# Patient Record
Sex: Female | Born: 1975 | Race: White | Hispanic: No | Smoking: Current some day smoker
Health system: Southern US, Community
[De-identification: ages and names within clinical notes are randomized; demographics above are authoritative.]

## PROBLEM LIST (undated history)

## (undated) ENCOUNTER — Ambulatory Visit (HOSPITAL_BASED_OUTPATIENT_CLINIC_OR_DEPARTMENT_OTHER): Payer: Self-pay

## (undated) DIAGNOSIS — E039 Hypothyroidism, unspecified: Secondary | ICD-10-CM

## (undated) DIAGNOSIS — F32A Depression, unspecified: Secondary | ICD-10-CM

## (undated) DIAGNOSIS — N739 Female pelvic inflammatory disease, unspecified: Secondary | ICD-10-CM

## (undated) DIAGNOSIS — D649 Anemia, unspecified: Secondary | ICD-10-CM

## (undated) DIAGNOSIS — M5126 Other intervertebral disc displacement, lumbar region: Secondary | ICD-10-CM

## (undated) DIAGNOSIS — F419 Anxiety disorder, unspecified: Secondary | ICD-10-CM

## (undated) DIAGNOSIS — T7840XA Allergy, unspecified, initial encounter: Secondary | ICD-10-CM

## (undated) DIAGNOSIS — F329 Major depressive disorder, single episode, unspecified: Secondary | ICD-10-CM

## (undated) DIAGNOSIS — K5900 Constipation, unspecified: Secondary | ICD-10-CM

## (undated) DIAGNOSIS — K219 Gastro-esophageal reflux disease without esophagitis: Secondary | ICD-10-CM

## (undated) DIAGNOSIS — M5020 Other cervical disc displacement, unspecified cervical region: Secondary | ICD-10-CM

## (undated) DIAGNOSIS — G43909 Migraine, unspecified, not intractable, without status migrainosus: Secondary | ICD-10-CM

## (undated) DIAGNOSIS — N921 Excessive and frequent menstruation with irregular cycle: Secondary | ICD-10-CM

## (undated) HISTORY — DX: Depression, unspecified: F32.A

## (undated) HISTORY — DX: Allergy, unspecified, initial encounter: T78.40XA

## (undated) HISTORY — PX: CARPAL TUNNEL RELEASE: SHX101

## (undated) HISTORY — PX: ANTERIOR CERVICAL DECOMPRESSION/DISCECTOMY FUSION 4 LEVEL/HARDWARE REMOVAL: SHX5551

## (undated) HISTORY — DX: Migraine, unspecified, not intractable, without status migrainosus: G43.909

## (undated) HISTORY — PX: TOOTH EXTRACTION: SUR596

## (undated) HISTORY — DX: Gastro-esophageal reflux disease without esophagitis: K21.9

## (undated) HISTORY — DX: Female pelvic inflammatory disease, unspecified: N73.9

## (undated) HISTORY — PX: TUBAL LIGATION: SHX77

## (undated) HISTORY — DX: Anemia, unspecified: D64.9

## (undated) HISTORY — DX: Hypothyroidism, unspecified: E03.9

## (undated) HISTORY — DX: Other intervertebral disc displacement, lumbar region: M51.26

## (undated) HISTORY — PX: OTHER SURGICAL HISTORY: SHX169

## (undated) HISTORY — DX: Constipation, unspecified: K59.00

## (undated) HISTORY — DX: Excessive and frequent menstruation with irregular cycle: N92.1

## (undated) HISTORY — DX: Anxiety disorder, unspecified: F41.9

## (undated) HISTORY — DX: Other cervical disc displacement, unspecified cervical region: M50.20

## (undated) HISTORY — PX: UPPER GASTROINTESTINAL ENDOSCOPY: SHX188

## (undated) HISTORY — PX: ABLATION: SHX5711

---
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
## 2002-02-01 HISTORY — PX: ESOPHAGOGASTRODUODENOSCOPY: SHX1529

## 2008-11-30 ENCOUNTER — Ambulatory Visit (HOSPITAL_BASED_OUTPATIENT_CLINIC_OR_DEPARTMENT_OTHER): Admission: RE | Admit: 2008-11-30 | Discharge: 2008-11-30 | Payer: Self-pay | Admitting: Orthopedic Surgery

## 2010-12-08 ENCOUNTER — Emergency Department (HOSPITAL_COMMUNITY)
Admission: EM | Admit: 2010-12-08 | Discharge: 2010-12-08 | Payer: Self-pay | Source: Home / Self Care | Admitting: Emergency Medicine

## 2011-02-25 LAB — POCT HEMOGLOBIN-HEMACUE: Hemoglobin: 14.4 g/dL (ref 12.0–15.0)

## 2011-03-26 NOTE — Op Note (Signed)
NAMEJOAQUINA, Alison Curry                 ACCOUNT NO.:  [id number]   MEDICAL RECORD NO.:  [id number]          PATIENT TYPE:  AMB   LOCATION:  DSC                          FACILITY:  [facility]   PHYSICIAN:  Cindee Salt, M.D.       DATE OF BIRTH:  Apr 16, 1976   DATE OF PROCEDURE:  11/30/2008  DATE OF DISCHARGE:                               OPERATIVE REPORT   PREOPERATIVE DIAGNOSIS:  Carpal tunnel syndrome, right hand.   POSTOPERATIVE DIAGNOSIS:  Carpal tunnel syndrome, right hand.   OPERATION:  Decompression of right median nerve.   SURGEON:  Cindee Salt, MD   ASSISTANT:  Carolyne Fiscal, RN   ANESTHESIA:  Forearm-based IV regional.   DATE OF OPERATION:  November 30, 2008.   HISTORY:  The patient is a 35 year old female with a history of carpal  tunnel syndrome, EMG nerve conductions positive.  This has not responded  to conservative treatment.  She has elected to undergo surgical  decompression.  Pre, peri and postoperative course has been discussed  along with risks and complications.  She is aware that there is no  guarantee with surgery, possibility of infection, recurrence, injury to  arteries, nerves, and tendons, incomplete relief of symptoms, and  dystrophy.  In the preoperative area, the patient is seen.  The  extremity marked by both the patient and surgeon.  Antibiotic given.   PROCEDURE:  The patient was brought to the operating room where a  forearm-based IV regional anesthetic was carried out without difficulty.  She was prepped using DuraPrep, supine position with a right arm free.  A longitudinal incision was made in the palm and carried down through  subcutaneous tissue.  Bleeders were electrocauterized.  Palmar fascia  was split.  Superficial palmar arch identified.  The flexor tendon to  the ring and little finger identified to the ulnar side of median nerve.  The carpal retinaculum was incised with sharp dissection.  A right-angle  and Sewall retractor were placed between skin and  forearm fascia.  The  fascia was released for approximately a centimeter and half proximal to  the wrist crease under direct vision.  Canal was explored.  No further  lesions were identified.  Area compression to the nerve was apparent.  The wound was copiously irrigated with saline.  The skin was closed with  interrupted 5-0 Vicryl Rapide sutures.  A local infiltration was then  given with 1% Xylocaine without epinephrine.  A sterile compressive  dressing and splint was applied.  On deflation of the tourniquet, all  fingers immediately pinked.  She was taken to the recovery room for  observation in satisfactory condition.  She will be discharged home to  return to the [facility] in 1 week, on Vicodin.          ______________________________  Cindee Salt, M.D.    GK/MEDQ  D:  11/30/2008  T:  12/01/2008  Job:  [id number]

## 2013-10-28 ENCOUNTER — Ambulatory Visit: Payer: BC Managed Care – PPO | Admitting: Physical Therapy

## 2013-11-01 ENCOUNTER — Ambulatory Visit: Payer: BC Managed Care – PPO | Attending: Neurology | Admitting: Physical Therapy

## 2013-11-01 DIAGNOSIS — R293 Abnormal posture: Secondary | ICD-10-CM | POA: Insufficient documentation

## 2013-11-01 DIAGNOSIS — IMO0001 Reserved for inherently not codable concepts without codable children: Secondary | ICD-10-CM | POA: Insufficient documentation

## 2013-11-01 DIAGNOSIS — R51 Headache: Secondary | ICD-10-CM | POA: Insufficient documentation

## 2013-11-01 DIAGNOSIS — M542 Cervicalgia: Secondary | ICD-10-CM | POA: Insufficient documentation

## 2013-11-08 ENCOUNTER — Encounter: Payer: BC Managed Care – PPO | Admitting: Physical Therapy

## 2013-11-09 ENCOUNTER — Encounter: Payer: BC Managed Care – PPO | Admitting: Physical Therapy

## 2013-11-15 ENCOUNTER — Encounter: Payer: BC Managed Care – PPO | Admitting: Physical Therapy

## 2015-07-14 ENCOUNTER — Ambulatory Visit: Payer: Self-pay | Admitting: Neurology

## 2015-07-18 ENCOUNTER — Ambulatory Visit: Payer: Self-pay | Admitting: Neurology

## 2015-07-21 ENCOUNTER — Ambulatory Visit (INDEPENDENT_AMBULATORY_CARE_PROVIDER_SITE_OTHER): Payer: BC Managed Care – PPO | Admitting: Neurology

## 2015-07-21 ENCOUNTER — Encounter: Payer: Self-pay | Admitting: Neurology

## 2015-07-21 VITALS — BP 102/58 | HR 81 | Temp 98.2°F | Resp 16 | Ht 62.1 in | Wt 128.6 lb

## 2015-07-21 DIAGNOSIS — G43719 Chronic migraine without aura, intractable, without status migrainosus: Secondary | ICD-10-CM | POA: Diagnosis not present

## 2015-07-21 DIAGNOSIS — G4440 Drug-induced headache, not elsewhere classified, not intractable: Secondary | ICD-10-CM

## 2015-07-21 DIAGNOSIS — G4441 Drug-induced headache, not elsewhere classified, intractable: Secondary | ICD-10-CM

## 2015-07-21 DIAGNOSIS — M542 Cervicalgia: Secondary | ICD-10-CM | POA: Diagnosis not present

## 2015-07-21 NOTE — Patient Instructions (Signed)
Migraine Recommendations: 1.  Decrease topiramate to  daily for 7 days and then stop.  We will get try to get pre-authorization for Botox 2.  Take Maxalt at earliest onset of headache.  May repeat dose once in 2 hours if needed.  Do not exceed two tablets in 24 hours. 3.  Limit use of pain relievers to no more than 2 days out of the week.  These medications include acetaminophen, ibuprofen, triptans and narcotics.  This will help reduce risk of rebound headaches.  STOP HYDROCODONE 4.  Be aware of common food triggers such as processed sweets, processed foods with nitrites (such as deli meat, hot dogs, sausages), foods with MSG, alcohol (such as wine), chocolate, certain cheeses, certain fruits (dried fruits, some citrus fruit), vinegar, diet soda. 4.  Avoid caffeine 5.  Routine exercise 6.  Proper sleep hygiene 7.  Stay adequately hydrated with water 8.  Keep a headache diary. 9.  Maintain proper stress management. 10.  Do not skip meals. 11.  Will get notes and MRI reports from other neurologist 12.  Follow up in 3 months tentatively for Botox

## 2015-07-21 NOTE — Progress Notes (Signed)
NEUROLOGY CONSULTATION NOTE  VICTORIAH WILDS MRN: [id number] DOB: Jan 16, 1976  Referring provider: Adela Lank, ACNP-C Primary care provider: Milus Height, MD  Reason for consult:  headache  HISTORY OF PRESENT ILLNESS: Alison Curry is a 39 year old right-handed female with depression, anxiety, hypothyroidism and history of B12 deficiency who presents for headache.  History obtained by patient, [facility] note and PCP note.  Labs reviewed.  Onset:  3 years ago.  She was initially diagnosed with trigeminal neuralgia, as it occurred on the right side following a root canal.   Later, she was diagnosed with combination of trigeminal neuralgia and migraine.  Workup included MRI of brain and cervical spine. Location: Started at base of the skull, radiating to the ears, jaw and head.   Initially it was right sided, then became bilateral.  She also reports neck pain. Quality:  Constant pain.  It was not paroxysmal or electric Intensity:  7-8/10, sometimes up to 10/10 Aura:  no Prodrome:  no Associated symptoms:  When severe, she develops nausea.  Duration:  Over a day Frequency:  25 headache days per month (8-10 severe migraine where she is incapacitated and nauseous)  Past abortive medication:  Advil, Aleve, Excedrin, Tylenol Past preventative medication:  Nortriptyline, amitriptyline, propranolol, atenolol, Cymbalta, Zoloft (all with side effects)  Current abortive medication:  Advil, hydrocodone (takes either one daily or most days), Maxalt (effective.  Takes for the severe migraine) Antihypertensive medications:  none Antidepressant medications:  lexapro  Anticonvulsant medications:  topiramate   Caffeine:  limited Alcohol:  rarely Smoker:  no Diet:  vegetarian Exercise:  routine Depression/stress:  Restarted work as Research officer, trade union:  Usually good except for the pain     PAST MEDICAL HISTORY: Past Medical History  Diagnosis Date  . Migraines   .  Hypothyroidism     PAST SURGICAL HISTORY: Past Surgical History  Procedure Laterality Date  . Cesarean section      x 2  . Carpal tunnel release Right   . Tooth extraction    . Tummy tuck    . Ablation      MEDICATIONS: No current outpatient prescriptions on file prior to visit.   No current facility-administered medications on file prior to visit.    ALLERGIES: No Known Allergies  FAMILY HISTORY: Family History  Problem Relation Age of Onset  . Osteoporosis Mother   . Atrial fibrillation Mother   . Heart disease Mother     SOCIAL HISTORY: Social History   Social History  . Marital Status: Married    Spouse Name: N/A  . Number of Children: N/A  . Years of Education: N/A   Occupational History  . Not on file.   Social History Main Topics  . Smoking status: Current Some Day Smoker    Types: Cigarettes  . Smokeless tobacco: Not on file     Comment: socially  . Alcohol Use: 0.0 oz/week    0 Standard drinks or equivalent per week     Comment: socially  . Drug Use: No  . Sexual Activity: Not on file   Other Topics Concern  . Not on file   Social History Narrative    REVIEW OF SYSTEMS: Constitutional: No fevers, chills, or sweats, no generalized fatigue, change in appetite Eyes: No visual changes, double vision, eye pain Ear, nose and throat: No hearing loss, ear pain, nasal congestion, sore throat Cardiovascular: No chest pain, palpitations Respiratory:  No shortness of breath at rest or with exertion,  wheezes GastrointestinaI: No nausea, vomiting, diarrhea, abdominal pain, fecal incontinence Genitourinary:  No dysuria, urinary retention or frequency Musculoskeletal:  No neck pain, back pain Integumentary: No rash, pruritus, skin lesions Neurological: as above Psychiatric: No depression, insomnia, anxiety Endocrine: No palpitations, fatigue, diaphoresis, mood swings, change in appetite, change in weight, increased thirst Hematologic/Lymphatic:  No  anemia, purpura, petechiae. Allergic/Immunologic: no itchy/runny eyes, nasal congestion, recent allergic reactions, rashes  PHYSICAL EXAM: Filed Vitals:   07/21/15 1422  BP: 102/58  Pulse: 81  Temp: 98.2 F (36.8 C)  Resp: 16   General: No acute distress.  Patient appears well-groomed.   Head:  Normocephalic/atraumatic Eyes:  fundi unremarkable, without vessel changes, exudates, hemorrhages or papilledema. Neck: supple, no paraspinal tenderness, full range of motion Back: No paraspinal tenderness Heart: regular rate and rhythm Lungs: Clear to auscultation bilaterally. Vascular: No carotid bruits. Neurological Exam: Mental status: alert and oriented to person, place, and time, recent and remote memory intact, fund of knowledge intact, attention and concentration intact, speech fluent and not dysarthric, language intact. Cranial nerves: CN I: not tested CN II: pupils equal, round and reactive to light, visual fields intact, fundi unremarkable, without vessel changes, exudates, hemorrhages or papilledema. CN III, IV, VI:  full range of motion, no nystagmus, no ptosis CN V: facial sensation intact CN VII: upper and lower face symmetric CN VIII: hearing intact CN IX, X: gag intact, uvula midline CN XI: sternocleidomastoid and trapezius muscles intact CN XII: tongue midline Bulk & Tone: normal, no fasciculations. Motor:  5/5 throughout Sensation:  Pinprick and vibration sensation intact. Deep Tendon Reflexes:  2+ throughout, toes downgoing.  Finger to nose testing:  Without dysmetria.  Heel to shin:  Without dysmetria.  Gait:  Normal station and stride.  Able to turn and tandem walk. Romberg negative.  IMPRESSION: Chronic migraine, complicated by medication overuse.  I don't think she has trigeminal neuralgia Neck pain with cervical radiculopathy.  Also possibly trigger for migraines.  PLAN: Taper off of topiramate.  Will try to get pre-authorization for Botox.  Side effects  discussed Stop hydrocodone.  Use Maxalt or Advil no more than 2 days out of the week.  Use Flexeril for neck and head pain. Will get prior notes and MRI reports from previous neurologist Follow up in 3 months  Thank you for allowing me to take part in the care of this patient.  Shon Millet, DO  CC:  Adela Lank, ACNP-C  Milus Height, MD

## 2015-07-24 ENCOUNTER — Telehealth: Payer: Self-pay

## 2015-07-24 NOTE — Telephone Encounter (Signed)
Patient called in today and states that she has a headache today. She states that headaches seem to happen everyday and tend to worsen as the day goes on. She states that at the end of the day her neck is hurting and her head is hurting. She would like to know if this could be a pinched nerve in her neck? She states that she just wants relief. She stated that she took a muscle relaxer and that has helped some. I told her we were working on getting her Botox approved. She just wanted me to ask you what could she do at this point. States that she does not want to sit around with pain. Thanks.

## 2015-07-25 NOTE — Telephone Encounter (Signed)
We can prescribe her a prednisone  taper (Take 6tabs x1day, then 5tabs x1day, then 4tabs x1day, then 3tabs x1day, then 2tabs x1day, then 1tab x1day, then STOP).  Again, this is a chronic issue for her and I unfortunately do not have something that will particularly stop the headache immediately.  It is something that will take time once we can start Botox.

## 2015-07-25 NOTE — Telephone Encounter (Signed)
Lmtcb. mah 

## 2015-07-26 NOTE — Telephone Encounter (Signed)
LMTCB.. MAH 

## 2015-07-28 NOTE — Telephone Encounter (Signed)
Spoke with patient and offered the Prednisone taper. She was not interested. I offered to ask Dr Everlena Cooper for any other suggestions, but since it is after 4:00 pm on a Friday I made the patient aware she would not hear from [place] until next week. She stated, "I'll just end up in the ER with a headache over the weekend" and hung up.

## 2015-08-02 ENCOUNTER — Telehealth: Payer: Self-pay | Admitting: Neurology

## 2015-08-02 NOTE — Telephone Encounter (Signed)
Alison Curry from [employer]/ PH# [phone]//called for the "Botox med cods [id number]//

## 2015-08-02 NOTE — Telephone Encounter (Signed)
Spoke with [employer]. Advised.

## 2015-08-07 ENCOUNTER — Telehealth: Payer: Self-pay | Admitting: *Deleted

## 2015-08-07 NOTE — Telephone Encounter (Signed)
Dr Everlena Cooper I looked in the system for an appeals letter but could not find one for medications, I can type up a letter if you send me the information I need

## 2015-08-07 NOTE — Telephone Encounter (Signed)
The pertinent information for Alison Curry is as follows:  Over 3 months of chronic migraine 25 headache days per month (8 to 10 days are severe migraine) Headaches last over 24 hours Failed multiple preventative medications including nortriptyline, amitriptyline, propranolol, atenolol, and topiramate Botox was initially denied because they said that the headache is "cervicogenic".  It is NOT cervicogenic.  She happens to also have neck pain in addition to the migraines.  Thank you

## 2015-08-10 ENCOUNTER — Encounter: Payer: Self-pay | Admitting: Neurology

## 2015-08-10 NOTE — Telephone Encounter (Signed)
Appeal letter done, on Dr Moises Blood desk to sign so I can fax back to [employer] and [employer]

## 2015-08-18 ENCOUNTER — Telehealth: Payer: Self-pay | Admitting: *Deleted

## 2015-08-18 NOTE — Telephone Encounter (Signed)
L/M THAT BOTOX WAS APPROVED WITH APPEAL

## 2015-08-23 ENCOUNTER — Ambulatory Visit (INDEPENDENT_AMBULATORY_CARE_PROVIDER_SITE_OTHER): Payer: BC Managed Care – PPO | Admitting: Neurology

## 2015-08-23 ENCOUNTER — Encounter: Payer: Self-pay | Admitting: Neurology

## 2015-08-23 VITALS — BP 90/60 | HR 86 | Ht 62.0 in | Wt 127.3 lb

## 2015-08-23 DIAGNOSIS — G43719 Chronic migraine without aura, intractable, without status migrainosus: Secondary | ICD-10-CM | POA: Diagnosis not present

## 2015-08-23 MED ORDER — ONABOTULINUMTOXINA 100 UNITS IJ SOLR
200.0000 [IU] | Freq: Once | INTRAMUSCULAR | Status: AC
  Administered 2015-08-23: 200 [IU] via INTRAMUSCULAR

## 2015-08-23 NOTE — Procedures (Signed)
BOTOX PROCEDURE NOTE  Risks and benefits were discussed with the patient, who appears to understand the discussion and verbal consent was obtained.  200 units were reconstituted with 4 mL 0.9% sodium chloride.  Skin was cleaned with alcohol prior to injection.  A 30-gauge, 0.5-inch needle was used.  Total:  4 mL (200 units)  # of injection sites Muscle   Dose Total 1 each side  Corrugator   0.2 mL (10 units) 1    Procerus   0.1 mL (5 units) 2 each side  Frontalis  0.4 mL (20 units) 4 each side  Temporalis  0.8 mL (40 units) 3 each side  Occipitalis  0.6 mL (30 units) 2 each side  Cervical paraspinals 0.4 mL (20 units) 3 each side  Trapezius  0.6 mL (30 units)    Total 31  Total used  155 units    Total wasted  45 units  The patient tolerated the procedure.    Adam R. Jaffe, DO 

## 2015-10-04 ENCOUNTER — Ambulatory Visit: Payer: BC Managed Care – PPO | Admitting: Neurology

## 2015-10-27 ENCOUNTER — Ambulatory Visit (INDEPENDENT_AMBULATORY_CARE_PROVIDER_SITE_OTHER): Payer: BC Managed Care – PPO | Admitting: Neurology

## 2015-10-27 ENCOUNTER — Encounter: Payer: Self-pay | Admitting: Neurology

## 2015-10-27 ENCOUNTER — Other Ambulatory Visit (INDEPENDENT_AMBULATORY_CARE_PROVIDER_SITE_OTHER): Payer: BC Managed Care – PPO

## 2015-10-27 VITALS — BP 108/60 | HR 75 | Ht 61.0 in | Wt 129.0 lb

## 2015-10-27 DIAGNOSIS — M791 Myalgia, unspecified site: Secondary | ICD-10-CM

## 2015-10-27 DIAGNOSIS — G43009 Migraine without aura, not intractable, without status migrainosus: Secondary | ICD-10-CM | POA: Diagnosis not present

## 2015-10-27 DIAGNOSIS — Z720 Tobacco use: Secondary | ICD-10-CM

## 2015-10-27 LAB — MAGNESIUM: Magnesium: 1.9 mg/dL (ref 1.5–2.5)

## 2015-10-27 LAB — CK: Total CK: 71 U/L (ref 7–177)

## 2015-10-27 MED ORDER — NAPROXEN SODIUM 550 MG PO TABS: 550.0000 mg | ORAL_TABLET | Freq: Two times a day (BID) | ORAL | Status: DC | PRN

## 2015-10-27 NOTE — Patient Instructions (Signed)
1.  If first dose of Maxalt ineffective, take second dose with naproxen 550mg .  May take naproxen every 12 hours as needed (limit to no more than 2 days out of the week)l 2.  Will check Mg and CK levels to evaluate for muscle soreness 3.  Follow up for next round of  botox

## 2015-10-27 NOTE — Progress Notes (Signed)
Chart forwarded.  

## 2015-10-27 NOTE — Progress Notes (Signed)
NEUROLOGY FOLLOW UP OFFICE NOTE  Jasper Loserammy M Vullo [id number]  HISTORY OF PRESENT ILLNESS: Allen Kellammy Dudash is a 39 year old right-handed female with depression, anxiety, tobacco abuse, hypothyroidism and history of B12 deficiency who presents for headache.  History obtained by patient and notes from [facility] neurologist [facility].  Report of brain MRI from 2014 reviewed.  UPDATE: Had first round of Botox on 08/23/15.  She has noted marked improvement.  She has had only mild headaches about 2 twice a month.  They usually respond to Advil and/or Maxalt.  However, she had a severe migraine on Wednesday that continued until the entire next day.  It did not respond to Maxalt.  It was also accompanied by a sharp pain in her left shoulder and arm.  Flexeril helps when she has tension in her neck.  Lately, she notes diffuse muscle aches when she wakes up in the morning, like she had just exercised hard.  There is no associated weakness.     HISTORY: Onset:  3 years ago.  She was initially diagnosed with trigeminal neuralgia, as it occurred on the right side following a root canal.   Later, she was diagnosed with combination of trigeminal neuralgia and migraine.  Workup included MRI of brain and cervical spine. Location: Started at base of the skull, radiating to the ears, jaw and head.   Initially it was right sided, then became bilateral.  She also reports neck pain. Quality:  Constant pain.  It was not paroxysmal or electric Initial intensity:  7-8/10, sometimes up to 10/10 Aura:  no Prodrome:  no Associated symptoms:  When severe, she develops nausea.  Initial Duration:  Over a day Initial Frequency:  25 headache days per month (8-10 severe migraine where she is incapacitated and nauseous)  Past abortive medication:  Advil, Aleve, Excedrin, Tylenol Past preventative medication:  Nortriptyline, amitriptyline, propranolol, atenolol, Cymbalta, Zoloft (all with side effects), topiramate 100mg   She  had an MRI of the brain without and with contrast performed 11/15/12, which was normal.    PAST MEDICAL HISTORY: Past Medical History  Diagnosis Date  . Migraines   . Hypothyroidism     MEDICATIONS: Current Outpatient Prescriptions on File Prior to Visit  Medication Sig Dispense Refill  . acyclovir (ZOVIRAX) 400 MG tablet Take 400 mg by mouth 2 (two) times daily as needed.  10  . ALPRAZolam (XANAX) 0.25 MG tablet Take 0.25 mg by mouth every 12 (twelve) hours.  2  . cyanocobalamin (,VITAMIN B-12,) 1000 MCG/ML injection Inject 1 mL into the muscle every 14 (fourteen) days.  1  . escitalopram (LEXAPRO) 10 MG tablet Take 1 tablet by mouth daily.    [name]. levothyroxine (SYNTHROID, LEVOTHROID) 112 MCG tablet Take 1 tablet by mouth daily.    . rizatriptan (MAXALT) 10 MG tablet TAKE 1 TABLET (10 MG TOTAL) BY MOUTH ONCE AS NEEDED. MAY TAKE A SECOND DOSE AFTER 2 HOURS IF NEEDED.  6   No current facility-administered medications on file prior to visit.    ALLERGIES: No Known Allergies  FAMILY HISTORY: Family History  Problem Relation Age of Onset  . Osteoporosis Mother   . Atrial fibrillation Mother   . Heart disease Mother     SOCIAL HISTORY: Social History   Social History  . Marital Status: Married    Spouse Name: N/A  . Number of Children: N/A  . Years of Education: N/A   Occupational History  . Not on file.   Social History Main  Topics  . Smoking status: Current Some Day Smoker    Types: Cigarettes  . Smokeless tobacco: Never Used     Comment: socially  . Alcohol Use: 0.0 oz/week    0 Standard drinks or equivalent per week     Comment: socially  . Drug Use: No  . Sexual Activity: Not on file   Other Topics Concern  . Not on file   Social History Narrative    REVIEW OF SYSTEMS: Constitutional: No fevers, chills, or sweats, no generalized fatigue, change in appetite Eyes: No visual changes, double vision, eye pain Ear, nose and throat: No hearing loss, ear pain,  nasal congestion, sore throat Cardiovascular: No chest pain, palpitations Respiratory:  No shortness of breath at rest or with exertion, wheezes GastrointestinaI: No nausea, vomiting, diarrhea, abdominal pain, fecal incontinence Genitourinary:  No dysuria, urinary retention or frequency Musculoskeletal:  No neck pain, back pain Integumentary: No rash, pruritus, skin lesions Neurological: as above Psychiatric: No depression, insomnia, anxiety Endocrine: No palpitations, fatigue, diaphoresis, mood swings, change in appetite, change in weight, increased thirst Hematologic/Lymphatic:  No anemia, purpura, petechiae. Allergic/Immunologic: no itchy/runny eyes, nasal congestion, recent allergic reactions, rashes  PHYSICAL EXAM: Filed Vitals:   10/27/15 1510  BP: 108/60  Pulse: 75   General: No acute distress.  Patient appears well-groomed.  normal body habitus. Head:  Normocephalic/atraumatic Eyes:  Fundoscopic exam unremarkable without vessel changes, exudates, hemorrhages or papilledema. Neck: supple, no paraspinal tenderness, full range of motion Heart:  Regular rate and rhythm Lungs:  Clear to auscultation bilaterally Back: No paraspinal tenderness Neurological Exam: alert and oriented to person, place, and time. Attention span and concentration intact, recent and remote memory intact, fund of knowledge intact.  Speech fluent and not dysarthric, language intact.  CN II-XII intact. Fundoscopic exam unremarkable without vessel changes, exudates, hemorrhages or papilledema.  Bulk and tone normal, muscle strength 5/5 throughout.  Sensation to light touch intact.  Deep tendon reflexes 2+ throughout.  Finger to nose and heel to shin testing intact.  Gait normal.  IMPRESSION: Migraine without aura, improved Myalgia.  I don't think it is related to Botox, particularly not typical for migraine use.    PLAN: 1.  Continue Maxalt.  If headache should not respond to first dose of Maxalt, may take  second dose with naproxen  2.  Flexeril for neck pain 3.  For myalgia, will check Mg and CK 4.  Follow up for next round of Botox 5.  Smoking cessation  15 minutes spent face to face with patient, over 50% spent discussing management.  Shon Millet, DO  CC:  Feliciana Rossetti

## 2015-10-31 ENCOUNTER — Telehealth: Payer: Self-pay

## 2015-10-31 NOTE — Telephone Encounter (Signed)
Message relayed to patient. Verbalized understanding and denied questions.   

## 2015-10-31 NOTE — Telephone Encounter (Signed)
-----   Message from Drema DallasAdam R Jaffe, DO sent at 10/30/2015  7:10 AM EST ----- Labs are normal.  There is no evidence of muscle injury in regards to her muscle aches.

## 2015-11-03 ENCOUNTER — Ambulatory Visit: Payer: BC Managed Care – PPO | Admitting: Neurology

## 2015-12-07 ENCOUNTER — Ambulatory Visit (INDEPENDENT_AMBULATORY_CARE_PROVIDER_SITE_OTHER): Payer: BC Managed Care – PPO | Admitting: Neurology

## 2015-12-07 DIAGNOSIS — G43009 Migraine without aura, not intractable, without status migrainosus: Secondary | ICD-10-CM

## 2015-12-07 MED ORDER — ONABOTULINUMTOXINA 100 UNITS IJ SOLR: 200.0000 [IU] | Freq: Once | INTRAMUSCULAR | Status: DC

## 2015-12-08 MED ORDER — ONABOTULINUMTOXINA 100 UNITS IJ SOLR
200.0000 [IU] | Freq: Once | INTRAMUSCULAR | Status: AC
  Administered 2015-12-08: 200 [IU] via INTRAMUSCULAR

## 2015-12-08 NOTE — Progress Notes (Signed)
See Procedure Note.  Patient has been headache-free

## 2015-12-08 NOTE — Procedures (Signed)
BOTOX PROCEDURE NOTE  Risks and benefits were discussed with the patient, who appears to understand the discussion and verbal consent was obtained.  200 units were reconstituted with 4 mL 0.9% sodium chloride.  Skin was cleaned with alcohol prior to injection.  A 30-gauge, 0.5-inch needle was used.  Total:  4 mL (200 units)  # of injection sites Muscle   Dose Total 1 each side  Corrugator   0.2 mL (10 units) 1    Procerus   0.2 mL (10 units) 2 each side  Frontalis  0.4 mL (20 units) 4 each side  Temporalis  0.8 mL (40 units) 3 each side  Occipitalis  0.6 mL (30 units) 2 each side  Cervical paraspinals 0.4 mL (20 units) 3 each side  Trapezius  0.6 mL (30 units)    Total 31  Total used  160 units    Total wasted  40 units  The patient tolerated the procedure.   Adam R. Jaffe, DO  

## 2015-12-18 DIAGNOSIS — G5600 Carpal tunnel syndrome, unspecified upper limb: Secondary | ICD-10-CM | POA: Insufficient documentation

## 2015-12-18 DIAGNOSIS — G500 Trigeminal neuralgia: Secondary | ICD-10-CM | POA: Insufficient documentation

## 2015-12-18 DIAGNOSIS — F339 Major depressive disorder, recurrent, unspecified: Secondary | ICD-10-CM | POA: Insufficient documentation

## 2015-12-18 DIAGNOSIS — J069 Acute upper respiratory infection, unspecified: Secondary | ICD-10-CM | POA: Insufficient documentation

## 2015-12-18 DIAGNOSIS — K2100 Gastro-esophageal reflux disease with esophagitis, without bleeding: Secondary | ICD-10-CM | POA: Insufficient documentation

## 2015-12-18 DIAGNOSIS — E162 Hypoglycemia, unspecified: Secondary | ICD-10-CM | POA: Insufficient documentation

## 2015-12-18 DIAGNOSIS — E539 Vitamin B deficiency, unspecified: Secondary | ICD-10-CM | POA: Insufficient documentation

## 2015-12-18 DIAGNOSIS — J309 Allergic rhinitis, unspecified: Secondary | ICD-10-CM | POA: Insufficient documentation

## 2015-12-18 DIAGNOSIS — F419 Anxiety disorder, unspecified: Secondary | ICD-10-CM | POA: Insufficient documentation

## 2015-12-18 DIAGNOSIS — G43709 Chronic migraine without aura, not intractable, without status migrainosus: Secondary | ICD-10-CM | POA: Insufficient documentation

## 2015-12-18 DIAGNOSIS — E559 Vitamin D deficiency, unspecified: Secondary | ICD-10-CM | POA: Insufficient documentation

## 2015-12-18 DIAGNOSIS — IMO0002 Reserved for concepts with insufficient information to code with codable children: Secondary | ICD-10-CM | POA: Insufficient documentation

## 2015-12-18 DIAGNOSIS — B009 Herpesviral infection, unspecified: Secondary | ICD-10-CM | POA: Insufficient documentation

## 2016-02-19 ENCOUNTER — Telehealth: Payer: Self-pay

## 2016-02-19 ENCOUNTER — Ambulatory Visit: Payer: BC Managed Care – PPO | Admitting: Neurology

## 2016-02-19 NOTE — Telephone Encounter (Signed)
Spoke with patient. Aware of schedule change. Is with mom in [place] who just got out of hospital. She is going to try to find someone to come sit with mother, so she can come in for injection (verbal orders given by Dr. Everlena CooperJaffe). Will call back if she is able to find someone.

## 2016-02-19 NOTE — Telephone Encounter (Signed)
Pt was planning on coming in for botox today. It is too early by about 2 weeks. You do not have any openings for me to squeeze her into ~26th. Correction:  I Can place her in May 4th slot. As your Thursday afternoons for the month of May have yet to be opened.  While on the phone w/ patient she states she is on day 2 of her migraine. Has taken her Maxalt but it is not helping much. Does not have a driver, but would like to come in to get injection. Okay to have pt come in for Toradol? Please advise.

## 2016-03-08 ENCOUNTER — Telehealth: Payer: Self-pay

## 2016-03-08 NOTE — Telephone Encounter (Signed)
Pt is approved for 3 visit for Botox.

## 2016-03-14 ENCOUNTER — Ambulatory Visit (INDEPENDENT_AMBULATORY_CARE_PROVIDER_SITE_OTHER): Payer: BC Managed Care – PPO | Admitting: Neurology

## 2016-03-14 DIAGNOSIS — G43719 Chronic migraine without aura, intractable, without status migrainosus: Secondary | ICD-10-CM | POA: Diagnosis not present

## 2016-03-14 MED ORDER — ONABOTULINUMTOXINA 100 UNITS IJ SOLR
200.0000 [IU] | Freq: Once | INTRAMUSCULAR | Status: AC
  Administered 2016-03-14: 200 [IU] via INTRAMUSCULAR

## 2016-03-14 NOTE — Procedures (Signed)
[facility]   Procedure Note Botox  Attending: Dr. Shon MilletAdam Jaffe  Preoperative Diagnosis(es): Chronic migraine  Consent obtained from: The patient Benefits discussed included, but were not limited to decreased muscle tightness, increased joint range of motion, and decreased pain.  Risk discussed included, but were not limited pain and discomfort, bleeding, bruising, excessive weakness, venous thrombosis, muscle atrophy and dysphagia.  Anticipated outcomes of the procedure as well as he risks and benefits of the alternatives to the procedure, and the roles and tasks of the personnel to be involved, were discussed with the patient, and the patient consents to the procedure and agrees to proceed. A copy of the patient medication guide was given to the patient which explains the blackbox warning.  Patients identity and treatment sites confirmed Yes.  .  Details of Procedure: Skin was cleaned with alcohol. Prior to injection, the needle plunger was aspirated to make sure the needle was not within a blood vessel.  There was no blood retrieved on aspiration.    Following is a summary of the muscles injected  And the amount of Botulinum toxin used:  Dilution 200 units of Botox was reconstituted with 4 ml of preservative free normal saline. Time of reconstitution: At the time of the office visit (<30 minutes prior to injection)   Injections  155 total units of Botox was injected with a 30 gauge needle.  Injection Sites: L occipitalis: 15 units- 3 sites  R occiptalis: 15 units- 3 sites  L upper trapezius: 15 units- 3 sites R upper trapezius: 15 units- 3 sits          L paraspinal (___mid__level): 10 units- 2 sites R paraspinal (__mid___level): 10 units- 2 sites  Face L frontalis(2 injection sites):10 units   R frontalis(2 injection sites):10 units         L corrugator: 5 units   R corrugator: 5 units           Procerus: 5 units   L temporalis: 20 units R temporalis: 20  units   Agent:  200 units of botulinum Type A (Onobotulinum Toxin type A) was reconstituted with 4 ml of preservative free normal saline.  Time of reconstitution: At the time of the office visit (<30 minutes prior to injection)     Total injected (Units): 155  Total wasted (Units): 45  Patient tolerated procedure well without complications.   Reinjection is anticipated in 3 months. Return to clinic in 4.5 months  Adam R. Everlena CooperJaffe, DO

## 2016-03-14 NOTE — Progress Notes (Signed)
Patient is headache-free

## 2016-06-20 ENCOUNTER — Ambulatory Visit (INDEPENDENT_AMBULATORY_CARE_PROVIDER_SITE_OTHER): Payer: BC Managed Care – PPO | Admitting: Neurology

## 2016-06-20 DIAGNOSIS — G43719 Chronic migraine without aura, intractable, without status migrainosus: Secondary | ICD-10-CM | POA: Diagnosis not present

## 2016-06-20 MED ORDER — ONABOTULINUMTOXINA 100 UNITS IJ SOLR
155.0000 [IU] | Freq: Once | INTRAMUSCULAR | Status: AC
  Administered 2016-06-20: 155 [IU] via INTRAMUSCULAR

## 2016-06-20 NOTE — Progress Notes (Signed)
See procedure note.

## 2016-06-20 NOTE — Procedures (Signed)
[facility]   Procedure Note Botox  Attending: Dr. Jaffe  Preoperative Diagnosis(es): Chronic migraine  Consent obtained from: The patient Benefits discussed included, but were not limited to decreased muscle tightness, increased joint range of motion, and decreased pain.  Risk discussed included, but were not limited pain and discomfort, bleeding, bruising, excessive weakness, venous thrombosis, muscle atrophy and dysphagia.  Anticipated outcomes of the procedure as well as he risks and benefits of the alternatives to the procedure, and the roles and tasks of the personnel to be involved, were discussed with the patient, and the patient consents to the procedure and agrees to proceed. A copy of the patient medication guide was given to the patient which explains the blackbox warning.  Patients identity and treatment sites confirmed Yes.  .  Details of Procedure: Skin was cleaned with alcohol. Prior to injection, the needle plunger was aspirated to make sure the needle was not within a blood vessel.  There was no blood retrieved on aspiration.    Following is a summary of the muscles injected  And the amount of Botulinum toxin used:  Dilution 200 units of Botox was reconstituted with 4 ml of preservative free normal saline. Time of reconstitution: At the time of the office visit (<30 minutes prior to injection)   Injections  155 total units of Botox was injected with a 30 gauge needle.  Injection Sites: L occipitalis: 15 units- 3 sites  R occiptalis: 15 units- 3 sites  L upper trapezius: 15 units- 3 sites R upper trapezius: 15 units- 3 sits          L paraspinal (___mid__level): 10 units- 2 sites R paraspinal (__mid___level): 10 units- 2 sites  Face L frontalis(2 injection sites):10 units   R frontalis(2 injection sites):10 units         L corrugator: 5 units   R corrugator: 5 units           Procerus: 5 units   L temporalis: 20 units R temporalis: 20 units   Agent:   200 units of botulinum Type A (Onobotulinum Toxin type A) was reconstituted with 4 ml of preservative free normal saline.  Time of reconstitution: At the time of the office visit (<30 minutes prior to injection)     Total injected (Units): 155  Total wasted (Units): 45  Patient tolerated procedure well without complications.   Reinjection is anticipated in 3 months.  

## 2016-06-21 ENCOUNTER — Ambulatory Visit: Payer: BC Managed Care – PPO | Admitting: Neurology

## 2016-07-24 ENCOUNTER — Ambulatory Visit: Payer: BC Managed Care – PPO | Admitting: Neurology

## 2016-08-27 DIAGNOSIS — E039 Hypothyroidism, unspecified: Secondary | ICD-10-CM | POA: Insufficient documentation

## 2016-09-16 ENCOUNTER — Ambulatory Visit (INDEPENDENT_AMBULATORY_CARE_PROVIDER_SITE_OTHER): Payer: BC Managed Care – PPO | Admitting: Neurology

## 2016-09-16 DIAGNOSIS — G43719 Chronic migraine without aura, intractable, without status migrainosus: Secondary | ICD-10-CM

## 2016-09-16 MED ORDER — ONABOTULINUMTOXINA 100 UNITS IJ SOLR
155.0000 [IU] | Freq: Once | INTRAMUSCULAR | Status: AC
  Administered 2016-09-16: 155 [IU] via INTRAMUSCULAR

## 2016-09-16 NOTE — Progress Notes (Signed)
See procedure note.

## 2016-09-16 NOTE — Procedures (Signed)
[facility]   Procedure Note Botox  Attending: Dr. Yerik Zeringue  Preoperative Diagnosis(es): Chronic migraine  Consent obtained from: The patient Benefits discussed included, but were not limited to decreased muscle tightness, increased joint range of motion, and decreased pain.  Risk discussed included, but were not limited pain and discomfort, bleeding, bruising, excessive weakness, venous thrombosis, muscle atrophy and dysphagia.  Anticipated outcomes of the procedure as well as he risks and benefits of the alternatives to the procedure, and the roles and tasks of the personnel to be involved, were discussed with the patient, and the patient consents to the procedure and agrees to proceed. A copy of the patient medication guide was given to the patient which explains the blackbox warning.  Patients identity and treatment sites confirmed Yes.  .  Details of Procedure: Skin was cleaned with alcohol. Prior to injection, the needle plunger was aspirated to make sure the needle was not within a blood vessel.  There was no blood retrieved on aspiration.    Following is a summary of the muscles injected  And the amount of Botulinum toxin used:  Dilution 200 units of Botox was reconstituted with 4 ml of preservative free normal saline. Time of reconstitution: At the time of the office visit (<30 minutes prior to injection)   Injections  155 total units of Botox was injected with a 30 gauge needle.  Injection Sites: L occipitalis: 15 units- 3 sites  R occiptalis: 15 units- 3 sites  L upper trapezius: 15 units- 3 sites R upper trapezius: 15 units- 3 sits          L paraspinal: 10 units- 2 sites R paraspinal: 10 units- 2 sites  Face L frontalis(2 injection sites):10 units   R frontalis(2 injection sites):10 units         L corrugator: 5 units   R corrugator: 5 units           Procerus: 5 units   L temporalis: 20 units R temporalis: 20 units   Agent:  200 units of botulinum Type A  (Onobotulinum Toxin type A) was reconstituted with 4 ml of preservative free normal saline.  Time of reconstitution: At the time of the office visit (<30 minutes prior to injection)     Total injected (Units): 155  Total wasted (Units): 45  Patient tolerated procedure well without complications.   Reinjection is anticipated in 3 months.   

## 2016-09-20 ENCOUNTER — Encounter: Payer: Self-pay | Admitting: Neurology

## 2016-09-20 ENCOUNTER — Ambulatory Visit: Payer: BC Managed Care – PPO | Admitting: Neurology

## 2016-09-20 VITALS — BP 110/74 | HR 73 | Ht 62.0 in | Wt 129.0 lb

## 2016-09-20 DIAGNOSIS — G43009 Migraine without aura, not intractable, without status migrainosus: Secondary | ICD-10-CM

## 2016-09-20 DIAGNOSIS — Z720 Tobacco use: Secondary | ICD-10-CM

## 2016-09-20 NOTE — Patient Instructions (Signed)
I am so glad you are feeling well 1.  Use naproxen or Maxalt for migraine. 2.  Follow up for next round of botox in 3 months 3.  Follow up for office visit in one year

## 2016-09-20 NOTE — Progress Notes (Signed)
NEUROLOGY FOLLOW UP OFFICE NOTE  Alison Curry [id number]  HISTORY OF PRESENT ILLNESS: Alison Curry is a 40 year old right-handed female with depression, anxiety, tobacco abuse, hypothyroidism and history of B12 deficiency who follows up for migraine.  She is accompanied by her husband.   UPDATE: Intensity:  3-4/10 Duration:  Until takes medicine, then quick Frequency:  2 to 5 days per month Current NSAIDS:  Naproxen (first line) Current analgesics:  no Current triptans:  Maxalt 10mg  (second line) Current anti-emetic:  no Current muscle relaxants:  Flexeril Current anti-anxiolytic:  alprazolam Current sleep aide:  no Current Antihypertensive medications:  no Current Antidepressant medications:  no Current Anticonvulsant medications:  no Current Vitamins/Herbal/Supplements:  B12 Current Antihistamines/Decongestants:  no Other therapy:  Botox  HISTORY: Onset:  3 years ago.  She was initially diagnosed with trigeminal neuralgia, as it occurred on the right side following a root canal.   Later, she was diagnosed with combination of trigeminal neuralgia and migraine.  Workup included MRI of brain and cervical spine. Location: Started at base of the skull, radiating to the ears, jaw and head.   Initially it was right sided, then became bilateral.  She also reports neck pain. Quality:  Constant pain.  It was not paroxysmal or electric Initial intensity:  7-8/10, sometimes up to 10/10 Aura:  no Prodrome:  no Associated symptoms:  When severe, she develops nausea.  Initial Duration:  Over a day Initial Frequency:  25 headache days per month (8-10 severe migraine where she is incapacitated and nauseous)   Past abortive medication:  Advil, Aleve, Excedrin, Tylenol, Vicodin Past preventative medication:  Nortriptyline, amitriptyline, propranolol, atenolol, Cymbalta, Zoloft (all with side effects), topiramate 100mg    She had an MRI of the brain without and with contrast performed 11/15/12,  which was normal.    PAST MEDICAL HISTORY: Past Medical History:  Diagnosis Date  . Hypothyroidism   . Migraines     MEDICATIONS: Current Outpatient Prescriptions on File Prior to Visit  Medication Sig Dispense Refill  . acyclovir (ZOVIRAX) 400 MG tablet Take 400 mg by mouth 2 (two) times daily as needed.  10  . ALPRAZolam (XANAX) 0.25 MG tablet Take 0.25 mg by mouth every 12 (twelve) hours.  2  . cyanocobalamin (,VITAMIN B-12,) 1000 MCG/ML injection Inject 1 mL into the muscle every 14 (fourteen) days.  1  . escitalopram (LEXAPRO) 10 MG tablet Take 1 tablet by mouth daily.    [name]. levothyroxine (SYNTHROID, LEVOTHROID) 112 MCG tablet Take 1 tablet by mouth daily.    . naproxen sodium (ANAPROX DS) 550 MG tablet Take 1 tablet (550 mg total) by mouth every 12 (twelve) hours as needed. 20 tablet 3  . rizatriptan (MAXALT) 10 MG tablet TAKE 1 TABLET (10 MG TOTAL) BY MOUTH ONCE AS NEEDED. MAY TAKE A SECOND DOSE AFTER 2 HOURS IF NEEDED.  6   No current facility-administered medications on file prior to visit.     ALLERGIES: No Known Allergies  FAMILY HISTORY: Family History  Problem Relation Age of Onset  . Osteoporosis Mother   . Atrial fibrillation Mother   . Heart disease Mother     SOCIAL HISTORY: Social History   Social History  . Marital status: Married    Spouse name: N/A  . Number of children: N/A  . Years of education: N/A   Occupational History  . Not on file.   Social History Main Topics  . Smoking status: Current Some Day Smoker  Types: Cigarettes  . Smokeless tobacco: Never Used     Comment: socially  . Alcohol use 0.0 oz/week     Comment: socially  . Drug use: No  . Sexual activity: Not on file   Other Topics Concern  . Not on file   Social History Narrative  . No narrative on file    REVIEW OF SYSTEMS: Constitutional: No fevers, chills, or sweats, no generalized fatigue, change in appetite Eyes: No visual changes, double vision, eye pain Ear,  nose and throat: No hearing loss, ear pain, nasal congestion, sore throat Cardiovascular: No chest pain, palpitations Respiratory:  No shortness of breath at rest or with exertion, wheezes GastrointestinaI: No nausea, vomiting, diarrhea, abdominal pain, fecal incontinence Genitourinary:  No dysuria, urinary retention or frequency Musculoskeletal:  No neck pain, back pain Integumentary: No rash, pruritus, skin lesions Neurological: as above Psychiatric: No depression, insomnia, anxiety Endocrine: No palpitations, fatigue, diaphoresis, mood swings, change in appetite, change in weight, increased thirst Hematologic/Lymphatic:  No purpura, petechiae. Allergic/Immunologic: no itchy/runny eyes, nasal congestion, recent allergic reactions, rashes  PHYSICAL EXAM: Vitals:   09/20/16 1126  BP: 110/74  Pulse: 73   General: No acute distress.  Patient appears well-groomed.  normal body habitus. Head:  Normocephalic/atraumatic Eyes:  Fundi examined but not visualized Neck: supple, no paraspinal tenderness, full range of motion Heart:  Regular rate and rhythm Lungs:  Clear to auscultation bilaterally Back: No paraspinal tenderness Neurological Exam: alert and oriented to person, place, and time. Attention span and concentration intact, recent and remote memory intact, fund of knowledge intact.  Speech fluent and not dysarthric, language intact.  CN II-XII intact. Bulk and tone normal, muscle strength 5/5 throughout.  Sensation to light touch  intact.  Deep tendon reflexes 2+ throughout.  Finger to nose testing intact.  Gait normal, Romberg negative.  IMPRESSION: Migraine without aura, significantly improved.  No longer chronic. Cigarette smoker  PLAN: 1.  Botox 2.  Naproxen and Maxalt for abortive therapy 3.  Follow up for Botox in 3 months.  Follow up in office in one year. 4.  Smoking cessation  21 minutes spent face to face with patient, over 50% spent counseling.  Shon MilletAdam Jaffe, DO  CC:    Feliciana RossettiGreg Grisso, MD

## 2016-09-20 NOTE — Progress Notes (Signed)
Chart forwarded.  

## 2016-12-13 ENCOUNTER — Ambulatory Visit: Payer: BC Managed Care – PPO | Admitting: Neurology

## 2016-12-18 ENCOUNTER — Telehealth: Payer: Self-pay | Admitting: Neurology

## 2016-12-18 NOTE — Telephone Encounter (Signed)
PT called and wanted to reschedule her Botox appointment and when I told her Dr Moises BloodJaffe's next Botox day was in May she said she could not wait that long/Dawn CB# [phone]

## 2016-12-19 ENCOUNTER — Encounter: Payer: Self-pay | Admitting: Neurology

## 2016-12-19 NOTE — Telephone Encounter (Signed)
We can schedule her in a follow up slot

## 2016-12-19 NOTE — Telephone Encounter (Signed)
Spoke to patient.  Advised will check to schedule Botox appt, making sure Botox is approved and sched follow up appt when I return to the office on Monday. Patient agreed.

## 2016-12-23 ENCOUNTER — Telehealth: Payer: Self-pay | Admitting: Neurology

## 2016-12-23 NOTE — Telephone Encounter (Signed)
Her # [phone] Alison Curry 04/14/1976. She had called earlier and thought someone just called her back. Thank you

## 2016-12-23 NOTE — Telephone Encounter (Signed)
Patient wants to resch her botox appt please advise she cnat answer her phone until at 3:30 [phone]

## 2016-12-24 NOTE — Telephone Encounter (Signed)
Spoke to patient. Rescheduled Botox appt.

## 2016-12-30 ENCOUNTER — Ambulatory Visit (INDEPENDENT_AMBULATORY_CARE_PROVIDER_SITE_OTHER): Payer: BC Managed Care – PPO | Admitting: Neurology

## 2016-12-30 DIAGNOSIS — G43009 Migraine without aura, not intractable, without status migrainosus: Secondary | ICD-10-CM | POA: Diagnosis not present

## 2016-12-30 MED ORDER — ONABOTULINUMTOXINA 100 UNITS IJ SOLR
155.0000 [IU] | Freq: Once | INTRAMUSCULAR | Status: AC
  Administered 2016-12-30: 155 [IU] via INTRAMUSCULAR

## 2016-12-30 NOTE — Progress Notes (Signed)
[facility]   Procedure Note Botox  Attending: Dr. Bowen Goyal  Preoperative Diagnosis(es): Chronic migraine  Consent obtained from: The patient Benefits discussed included, but were not limited to decreased muscle tightness, increased joint range of motion, and decreased pain.  Risk discussed included, but were not limited pain and discomfort, bleeding, bruising, excessive weakness, venous thrombosis, muscle atrophy and dysphagia.  Anticipated outcomes of the procedure as well as he risks and benefits of the alternatives to the procedure, and the roles and tasks of the personnel to be involved, were discussed with the patient, and the patient consents to the procedure and agrees to proceed. A copy of the patient medication guide was given to the patient which explains the blackbox warning.  Patients identity and treatment sites confirmed Yes.  .  Details of Procedure: Skin was cleaned with alcohol. Prior to injection, the needle plunger was aspirated to make sure the needle was not within a blood vessel.  There was no blood retrieved on aspiration.    Following is a summary of the muscles injected  And the amount of Botulinum toxin used:  Dilution 200 units of Botox was reconstituted with 4 ml of preservative free normal saline. Time of reconstitution: At the time of the office visit (<30 minutes prior to injection)   Injections  155 total units of Botox was injected with a 30 gauge needle.  Injection Sites: L occipitalis: 15 units- 3 sites  R occiptalis: 15 units- 3 sites  L upper trapezius: 15 units- 3 sites R upper trapezius: 15 units- 3 sits          L paraspinal: 10 units- 2 sites R paraspinal: 10 units- 2 sites  Face L frontalis(2 injection sites):10 units   R frontalis(2 injection sites):10 units         L corrugator: 5 units   R corrugator: 5 units           Procerus: 5 units   L temporalis: 20 units R temporalis: 20 units   Agent:  200 units of botulinum Type A  (Onobotulinum Toxin type A) was reconstituted with 4 ml of preservative free normal saline.  Time of reconstitution: At the time of the office visit (<30 minutes prior to injection)     Total injected (Units): 155  Total wasted (Units): 45  Patient tolerated procedure well without complications.   Reinjection is anticipated in 3 months.  Marlen Koman R. Alazay Leicht, DO   

## 2017-01-28 DIAGNOSIS — M5416 Radiculopathy, lumbar region: Secondary | ICD-10-CM | POA: Insufficient documentation

## 2017-03-10 ENCOUNTER — Other Ambulatory Visit: Payer: Self-pay | Admitting: Neurology

## 2017-03-14 ENCOUNTER — Ambulatory Visit (INDEPENDENT_AMBULATORY_CARE_PROVIDER_SITE_OTHER): Payer: BC Managed Care – PPO | Admitting: Neurology

## 2017-03-14 ENCOUNTER — Encounter: Payer: Self-pay | Admitting: Neurology

## 2017-03-14 DIAGNOSIS — G43719 Chronic migraine without aura, intractable, without status migrainosus: Secondary | ICD-10-CM | POA: Diagnosis not present

## 2017-03-14 MED ORDER — ONABOTULINUMTOXINA 100 UNITS IJ SOLR
155.0000 [IU] | Freq: Once | INTRAMUSCULAR | Status: AC
  Administered 2017-03-14: 155 [IU] via INTRAMUSCULAR

## 2017-03-14 NOTE — Progress Notes (Signed)
[facility]   Procedure Note Botox  Attending: Dr. Jaffe  Preoperative Diagnosis(es): Chronic migraine  Consent obtained from: The patient Benefits discussed included, but were not limited to decreased muscle tightness, increased joint range of motion, and decreased pain.  Risk discussed included, but were not limited pain and discomfort, bleeding, bruising, excessive weakness, venous thrombosis, muscle atrophy and dysphagia.  Anticipated outcomes of the procedure as well as he risks and benefits of the alternatives to the procedure, and the roles and tasks of the personnel to be involved, were discussed with the patient, and the patient consents to the procedure and agrees to proceed. A copy of the patient medication guide was given to the patient which explains the blackbox warning.  Patients identity and treatment sites confirmed Yes.  .  Details of Procedure: Skin was cleaned with alcohol. Prior to injection, the needle plunger was aspirated to make sure the needle was not within a blood vessel.  There was no blood retrieved on aspiration.    Following is a summary of the muscles injected  And the amount of Botulinum toxin used:  Dilution 200 units of Botox was reconstituted with 4 ml of preservative free normal saline. Time of reconstitution: At the time of the office visit (<30 minutes prior to injection)   Injections  155 total units of Botox was injected with a 30 gauge needle.  Injection Sites: L occipitalis: 15 units- 3 sites  R occiptalis: 15 units- 3 sites  L upper trapezius: 15 units- 3 sites R upper trapezius: 15 units- 3 sits          L paraspinal: 10 units- 2 sites R paraspinal: 10 units- 2 sites  Face L frontalis(2 injection sites):10 units   R frontalis(2 injection sites):10 units         L corrugator: 5 units   R corrugator: 5 units           Procerus: 5 units   L temporalis: 20 units R temporalis: 20 units   Agent:  200 units of botulinum Type A  (Onobotulinum Toxin type A) was reconstituted with 4 ml of preservative free normal saline.  Time of reconstitution: At the time of the office visit (<30 minutes prior to injection)     Total injected (Units): 155  Total wasted (Units): none wasted  Patient tolerated procedure well without complications.   Reinjection is anticipated in 3 months. Return to clinic in 4.5 months  Adam R Jaffe, DO  

## 2017-06-09 ENCOUNTER — Telehealth: Payer: Self-pay | Admitting: Neurology

## 2017-06-09 NOTE — Telephone Encounter (Signed)
Dr. Everlena CooperJaffe please advise if you can fit her in or if she needs to wait for next Botox day.

## 2017-06-09 NOTE — Telephone Encounter (Signed)
Alison HarmanDana- did she tell you which days she would be out of town so I can look for appt?

## 2017-06-09 NOTE — Telephone Encounter (Signed)
Patient has a botox appt on 06-20-17 and needs to resch appt due to her having to be out of town. Dr Everlena Cooperjaffe next Botox day is 08-01-17 and she states that she cant wait that long so she wants to know if he will see her before then for a botox injection please call patient and let her know

## 2017-06-09 NOTE — Telephone Encounter (Signed)
Waiting until the next Botox is too long.  Fit her in elsewhere in early August (her last Botox was 03/14/17 and it should be every 3 months).

## 2017-06-10 NOTE — Telephone Encounter (Signed)
PT called and said she has not heard back about the rescheduling of Botox, she is out of town from the 8th of August to the 12th or 13th, is open and flexible until the 20th of August

## 2017-06-10 NOTE — Telephone Encounter (Signed)
No she did not tell me the dates

## 2017-06-10 NOTE — Telephone Encounter (Signed)
Appt made for 06/24/17. Patient made aware.

## 2017-06-16 ENCOUNTER — Ambulatory Visit: Payer: BC Managed Care – PPO | Admitting: Neurology

## 2017-06-20 ENCOUNTER — Ambulatory Visit: Payer: BC Managed Care – PPO | Admitting: Neurology

## 2017-06-24 ENCOUNTER — Ambulatory Visit: Payer: BC Managed Care – PPO | Admitting: Neurology

## 2017-07-09 ENCOUNTER — Ambulatory Visit (INDEPENDENT_AMBULATORY_CARE_PROVIDER_SITE_OTHER): Payer: BC Managed Care – PPO | Admitting: Neurology

## 2017-07-09 DIAGNOSIS — G43719 Chronic migraine without aura, intractable, without status migrainosus: Secondary | ICD-10-CM | POA: Diagnosis not present

## 2017-07-09 MED ORDER — ONABOTULINUMTOXINA 100 UNITS IJ SOLR
155.0000 [IU] | Freq: Once | INTRAMUSCULAR | Status: AC
  Administered 2017-07-09: 155 [IU] via INTRAMUSCULAR

## 2017-07-09 NOTE — Progress Notes (Signed)
[facility]   Procedure Note Botox  Attending: Dr. Everlena CooperJaffe  Preoperative Diagnosis(es): Chronic migraine  Consent obtained from: The patient Benefits discussed included, but were not limited to decreased muscle tightness, increased joint range of motion, and decreased pain.  Risk discussed included, but were not limited pain and discomfort, bleeding, bruising, excessive weakness, venous thrombosis, muscle atrophy and dysphagia.  Anticipated outcomes of the procedure as well as he risks and benefits of the alternatives to the procedure, and the roles and tasks of the personnel to be involved, were discussed with the patient, and the patient consents to the procedure and agrees to proceed. A copy of the patient medication guide was given to the patient which explains the blackbox warning.  Patients identity and treatment sites confirmed Yes.  .  Details of Procedure: Skin was cleaned with alcohol. Prior to injection, the needle plunger was aspirated to make sure the needle was not within a blood vessel.  There was no blood retrieved on aspiration.    Following is a summary of the muscles injected  And the amount of Botulinum toxin used:  Dilution 200 units of Botox was reconstituted with 4 ml of preservative free normal saline. Time of reconstitution: At the time of the office visit (<30 minutes prior to injection)   Injections  155 total units of Botox was injected with a 30 gauge needle.  Injection Sites: L occipitalis: 15 units- 3 sites  R occiptalis: 15 units- 3 sites  L upper trapezius: 15 units- 3 sites R upper trapezius: 15 units- 3 sits          L paraspinal: 10 units- 2 sites R paraspinal: 10 units- 2 sites  Face L frontalis(2 injection sites):10 units   R frontalis(2 injection sites):10 units         L corrugator: 5 units   R corrugator: 5 units           Procerus: 5 units   L temporalis: 20 units R temporalis: 20 units   Agent:  200 units of botulinum Type A  (Onobotulinum Toxin type A) was reconstituted with 4 ml of preservative free normal saline.  Time of reconstitution: At the time of the office visit (<30 minutes prior to injection)     Total injected (Units): 155  Total wasted (Units): 45  Patient tolerated procedure well without complications.   Reinjection is anticipated in 3 months.  Shon MilletAdam Jaffe, DO

## 2017-08-05 ENCOUNTER — Ambulatory Visit: Payer: BC Managed Care – PPO | Admitting: Neurology

## 2017-09-26 ENCOUNTER — Ambulatory Visit (INDEPENDENT_AMBULATORY_CARE_PROVIDER_SITE_OTHER): Payer: BC Managed Care – PPO | Admitting: Neurology

## 2017-09-26 DIAGNOSIS — G43719 Chronic migraine without aura, intractable, without status migrainosus: Secondary | ICD-10-CM | POA: Diagnosis not present

## 2017-09-26 MED ORDER — ONABOTULINUMTOXINA 100 UNITS IJ SOLR
100.0000 [IU] | Freq: Once | INTRAMUSCULAR | Status: AC
  Administered 2017-09-26: 100 [IU] via INTRAMUSCULAR

## 2017-09-26 NOTE — Progress Notes (Signed)
[facility]  ° °Procedure Note Botox ° °Attending: Dr. Suzzette Gasparro ° °Preoperative Diagnosis(es): Chronic migraine ° °Consent obtained from: The patient °Benefits discussed included, but were not limited to decreased muscle tightness, increased joint range of motion, and decreased pain.  Risk discussed included, but were not limited pain and discomfort, bleeding, bruising, excessive weakness, venous thrombosis, muscle atrophy and dysphagia.  Anticipated outcomes of the procedure as well as he risks and benefits of the alternatives to the procedure, and the roles and tasks of the personnel to be involved, were discussed with the patient, and the patient consents to the procedure and agrees to proceed. A copy of the patient medication guide was given to the patient which explains the blackbox warning. ° °Patients identity and treatment sites confirmed Yes.  . ° °Details of Procedure: °Skin was cleaned with alcohol. Prior to injection, the needle plunger was aspirated to make sure the needle was not within a blood vessel.  There was no blood retrieved on aspiration.   ° °Following is a summary of the muscles injected  And the amount of Botulinum toxin used: ° °Dilution °200 units of Botox was reconstituted with 4 ml of preservative free normal saline. °Time of reconstitution: At the time of the office visit (<30 minutes prior to injection)  ° °Injections  °155 total units of Botox was injected with a 30 gauge needle. ° °Injection Sites: °L occipitalis: 15 units- 3 sites  °R occiptalis: 15 units- 3 sites ° °L upper trapezius: 15 units- 3 sites °R upper trapezius: 15 units- 3 sits          °L paraspinal: 10 units- 2 sites °R paraspinal: 10 units- 2 sites ° °Face °L frontalis(2 injection sites):10 units   °R frontalis(2 injection sites):10 units         °L corrugator: 5 units   °R corrugator: 5 units           °Procerus: 5 units   °L temporalis: 20 units °R temporalis: 20 units  ° °Agent:  °200 units of botulinum Type  A (Onobotulinum Toxin type A) was reconstituted with 4 ml of preservative free normal saline.  °Time of reconstitution: At the time of the office visit (<30 minutes prior to injection)  ° ° ° Total injected (Units):  155 ° Total wasted (Units):  45 ° °Patient tolerated procedure well without complications.   °Reinjection is anticipated in 3 months. ° ° °

## 2017-11-12 ENCOUNTER — Telehealth: Payer: Self-pay | Admitting: Neurology

## 2017-11-12 ENCOUNTER — Ambulatory Visit: Payer: BC Managed Care – PPO | Admitting: Neurology
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
## 2017-11-12 NOTE — Telephone Encounter (Signed)
Called and spoke with Pt, advsd her we do not have an opening on 1/21, but any other 7:30a appt will be fine. Pt said she will call back when she is ready to make appt

## 2017-11-12 NOTE — Telephone Encounter (Signed)
Dr Everlena CooperJaffe- OK to put her in at 1pm on the 21st?

## 2017-11-12 NOTE — Telephone Encounter (Signed)
Patient had appt today and had to cancel it  Due to her work being 45 mins away. She states that she would have to take a whole day off of work to come to her appt. She wants to know if Dr Everlena CooperJaffe will squeeze her in on Jan 21. He is booked up that Day only has his urgent  workin slot open for a new patient. She would like to talk to his nurse to see what Dr Everlena CooperJaffe said. I offered her appt on Jan 4th and Jan 11 but again she states that she would not be able to take those appt due to her having to take a whole day of work off.

## 2017-11-13 NOTE — Telephone Encounter (Signed)
Called and spoke with Pt, she accepted the appt

## 2017-11-13 NOTE — Telephone Encounter (Signed)
I can squeeze her in my lunch hour.  Have her arrive at 11:45.

## 2017-11-26 NOTE — Progress Notes (Addendum)
Submitted Botox PA on Cover My Meds, rcvd approval PA case ID: [id number]. Will receive fax with approval info also. Valid 11/26/17 - 11/26/2018

## 2017-12-01 ENCOUNTER — Ambulatory Visit (INDEPENDENT_AMBULATORY_CARE_PROVIDER_SITE_OTHER): Payer: BC Managed Care – PPO | Admitting: Neurology

## 2017-12-01 ENCOUNTER — Encounter: Payer: Self-pay | Admitting: Neurology

## 2017-12-01 VITALS — BP 100/70 | HR 92 | Ht 61.0 in | Wt 134.1 lb

## 2017-12-01 DIAGNOSIS — G43009 Migraine without aura, not intractable, without status migrainosus: Secondary | ICD-10-CM | POA: Diagnosis not present

## 2017-12-01 MED ORDER — RIZATRIPTAN BENZOATE 10 MG PO TABS: ORAL_TABLET | ORAL | 6 refills | Status: DC

## 2017-12-01 NOTE — Progress Notes (Signed)
NEUROLOGY FOLLOW UP OFFICE NOTE  Alison Curry Alison Curry [id number]  HISTORY OF PRESENT ILLNESS: Alison Curry Gama is a 42 year old right-handed female with depression, anxiety, tobacco abuse, hypothyroidism and history of B12 deficiency who follows up for migraine.  She is accompanied by her son who supplements history.   UPDATE: Currently with migraine.  This is the first migraine in months.  She also reports intermittent aural fullness in her right ear.  She was told that she does not have fluid behind the ear.  She has some jaw pain too.  She grinds her teeth.    Intensity:  3-4/10 Duration:  Until takes medicine, then quick Frequency:  2 to 5 days per month Current NSAIDS:  Naproxen (first line) Current analgesics:  no Current triptans:  Maxalt 10mg  (second line) Current anti-emetic:  no Current muscle relaxants:  Flexeril Current anti-anxiolytic:  alprazolam Current sleep aide:  no Current Antihypertensive medications:  no Current Antidepressant medications:  Lexapro Current Anticonvulsant medications:  no Current Vitamins/Herbal/Supplements:  B12 Current Antihistamines/Decongestants:  no Other therapy:  Botox   HISTORY: Onset:  3 years ago.  She was initially diagnosed with trigeminal neuralgia, as it occurred on the right side following a root canal.   Later, she was diagnosed with combination of trigeminal neuralgia and migraine.  Workup included MRI of brain and cervical spine. Location: Started at base of the skull, radiating to the ears, jaw and head.   Initially it was right sided, then became bilateral.  She also reports neck pain. Quality:  Constant pain.  It was not paroxysmal or electric Initial intensity:  7-8/10, sometimes up to 10/10 Aura:  no Prodrome:  no Associated symptoms:  When severe, she develops nausea.  Initial Duration:  Over a day Initial Frequency:  25 headache days per month (8-10 severe migraine where she is incapacitated and nauseous)   Past abortive  medication:  Advil, Aleve, Excedrin, Tylenol, Vicodin Past preventative medication:  Nortriptyline, amitriptyline, propranolol, atenolol, Cymbalta, Zoloft (all with side effects), topiramate 100mg    She had an MRI of the brain without and with contrast performed 11/15/12, which was normal.    PAST MEDICAL HISTORY: Past Medical History:  Diagnosis Date  . Hypothyroidism   . Migraines     MEDICATIONS: Current Outpatient Medications on File Prior to Visit  Medication Sig Dispense Refill  . acyclovir (ZOVIRAX) 400 MG tablet Take 400 mg by mouth 2 (two) times daily as needed.  10  . ALPRAZolam (XANAX) 0.25 MG tablet Take 0.25 mg by mouth every 12 (twelve) hours.  2  . cyanocobalamin (,VITAMIN B-12,) 1000 MCG/ML injection Inject 1 mL into the muscle every 14 (fourteen) days.  1  . escitalopram (LEXAPRO) 10 MG tablet Take 1 tablet by mouth daily.    [name]. HYDROcodone-acetaminophen (NORCO/VICODIN) 5-325 MG tablet Take by mouth.    . levothyroxine (SYNTHROID, LEVOTHROID) 112 MCG tablet Take 1 tablet by mouth daily.    . naproxen sodium (ANAPROX) 550 MG tablet TAKE 1 TABLET BY MOUTH EVERY 12 HOURS AS NEEDED 20 tablet 3   No current facility-administered medications on file prior to visit.     ALLERGIES: Allergies  Allergen Reactions  . Bupropion Other (See Comments)    Nervousness  . Cephalexin Other (See Comments)    nervousness    FAMILY HISTORY: Family History  Problem Relation Age of Onset  . Osteoporosis Mother   . Atrial fibrillation Mother   . Heart disease Mother     SOCIAL HISTORY:  Social History   Socioeconomic History  . Marital status: Married    Spouse name: Not on file  . Number of children: Not on file  . Years of education: Not on file  . Highest education level: Not on file  Social Needs  . Financial resource strain: Not on file  . Food insecurity - worry: Not on file  . Food insecurity - inability: Not on file  . Transportation needs - medical: Not on file    . Transportation needs - non-medical: Not on file  Occupational History  . Not on file  Tobacco Use  . Smoking status: Current Some Day Smoker    Types: Cigarettes  . Smokeless tobacco: Never Used  . Tobacco comment: socially  Substance and Sexual Activity  . Alcohol use: Yes    Alcohol/week: 0.0 oz    Comment: socially  . Drug use: No  . Sexual activity: Not on file  Other Topics Concern  . Not on file  Social History Narrative  . Not on file    REVIEW OF SYSTEMS: Constitutional: No fevers, chills, or sweats, no generalized fatigue, change in appetite Eyes: No visual changes, double vision, eye pain Ear, nose and throat: right aural fullness/pain Cardiovascular: No chest pain, palpitations Respiratory:  No shortness of breath at rest or with exertion, wheezes GastrointestinaI: No nausea, vomiting, diarrhea, abdominal pain, fecal incontinence Genitourinary:  No dysuria, urinary retention or frequency Musculoskeletal: neck pain Integumentary: No rash, pruritus, skin lesions Neurological: as above Psychiatric: No depression, insomnia, anxiety Endocrine: No palpitations, fatigue, diaphoresis, mood swings, change in appetite, change in weight, increased thirst Hematologic/Lymphatic:  No purpura, petechiae. Allergic/Immunologic: no itchy/runny eyes, nasal congestion, recent allergic reactions, rashes  PHYSICAL EXAM: Vitals:   12/01/17 1154  BP: 100/70  Pulse: 92  SpO2: 99%   General: No acute distress.  Patient appears well-groomed.  normal body habitus. Head:  Normocephalic/atraumatic.  Right TMJ tenderness. Eyes:  Fundi examined but not visualized Neck: supple, right sided suboccipital and paraspinal tenderness, full range of motion Heart:  Regular rate and rhythm Lungs:  Clear to auscultation bilaterally Back: No paraspinal tenderness Neurological Exam: alert and oriented to person, place, and time. Attention span and concentration intact, recent and remote memory  intact, fund of knowledge intact.  Speech fluent and not dysarthric, language intact.  CN II-XII intact. Bulk and tone normal, muscle strength 5/5 throughout.  Sensation to light touch  intact.  Deep tendon reflexes 2+ throughout.  Finger to nose testing intact.  Gait normal, Romberg negative.  IMPRESSION: 1.  Migraines, controlled.  Today with migraine.  Did not take pain reliever. 2.  Right sided aural fullness.  Consider eustachian tube dysfunction or TMJ dysfunction.[name]  PLAN: 1.  Since she has ongoing migraine with no abortive medication on her, we will give her a headache cocktail in the office (Toradol 60mg Ceasar Mons 10mg /Benadryl 25mg ).  Her son is her driver. 2.  Continue Naproxen and Maxalt as needed. 3.  Follow up with PCP regarding aural fullness 4.  Follow up for Botox.  Shon Millet, DO  CC:  Feliciana Rossetti, MD

## 2017-12-01 NOTE — Patient Instructions (Signed)
1.  We will give you a headache cocktail here.  Discuss with your PCP about getting your ear checked. 2.  Use the Maxalt or naproxen earliest onset of the headache.  Use Flexeril for neck tension. 3.  Follow up for Botox

## 2018-01-02 ENCOUNTER — Ambulatory Visit (INDEPENDENT_AMBULATORY_CARE_PROVIDER_SITE_OTHER): Payer: BC Managed Care – PPO | Admitting: Neurology

## 2018-01-02 ENCOUNTER — Ambulatory Visit: Payer: BC Managed Care – PPO | Admitting: Neurology

## 2018-01-02 DIAGNOSIS — G43719 Chronic migraine without aura, intractable, without status migrainosus: Secondary | ICD-10-CM | POA: Diagnosis not present

## 2018-01-02 NOTE — Progress Notes (Signed)
[facility]   Procedure Note Botox  Attending: Dr. Shon MilletAdam Jaffe  Preoperative Diagnosis(es): Chronic migraine  Consent obtained from: The patient Benefits discussed included, but were not limited to decreased muscle tightness, increased joint range of motion, and decreased pain.  Risk discussed included, but were not limited pain and discomfort, bleeding, bruising, excessive weakness, venous thrombosis, muscle atrophy and dysphagia.  Anticipated outcomes of the procedure as well as he risks and benefits of the alternatives to the procedure, and the roles and tasks of the personnel to be involved, were discussed with the patient, and the patient consents to the procedure and agrees to proceed. A copy of the patient medication guide was given to the patient which explains the blackbox warning.  Patients identity and treatment sites confirmed Yes.  .  Details of Procedure: Skin was cleaned with alcohol. Prior to injection, the needle plunger was aspirated to make sure the needle was not within a blood vessel.  There was no blood retrieved on aspiration.    Following is a summary of the muscles injected  And the amount of Botulinum toxin used:  Dilution 200 units of Botox was reconstituted with 4 ml of preservative free normal saline. Time of reconstitution: At the time of the office visit (<30 minutes prior to injection)   Injections  155 total units of Botox was injected with a 30 gauge needle.  Injection Sites: L occipitalis: 15 units- 3 sites  R occiptalis: 15 units- 3 sites  L upper trapezius: 15 units- 3 sites R upper trapezius: 15 units- 3 sits          L paraspinal: 10 units- 2 sites R paraspinal: 10 units- 2 sites  Face L frontalis(2 injection sites):10 units   R frontalis(2 injection sites):10 units         L corrugator: 5 units   R corrugator: 5 units           Procerus: 5 units   L temporalis: 20 units R temporalis: 20 units   Agent:  200 units of botulinum Type  A (Onobotulinum Toxin type A) was reconstituted with 4 ml of preservative free normal saline.  Time of reconstitution: At the time of the office visit (<30 minutes prior to injection)     Total injected (Units): 155  Total wasted (Units): none wasted  Patient tolerated procedure well without complications.   Reinjection is anticipated in 3 months. Return to clinic in 4.5 months.

## 2018-03-09 ENCOUNTER — Encounter: Payer: Self-pay | Admitting: Neurology

## 2018-04-07 ENCOUNTER — Telehealth: Payer: Self-pay | Admitting: Neurology

## 2018-04-07 NOTE — Telephone Encounter (Signed)
Pt left a VM message saying she wanted to reschedule her Friday Botox, do I give her Dr Moises Blood next Botox day or does he want to see her on another day for Botox

## 2018-04-07 NOTE — Telephone Encounter (Signed)
Dawn,  Please let the Pt know, Botox is only effective if injected every 90 days. If we have an appointment within 10 days that will be ok. If she waits until the next Botox day, it is like starting therapy over.  Thanks

## 2018-04-10 ENCOUNTER — Ambulatory Visit: Payer: BC Managed Care – PPO | Admitting: Neurology

## 2018-04-20 ENCOUNTER — Telehealth: Payer: Self-pay | Admitting: Neurology

## 2018-04-20 NOTE — Telephone Encounter (Signed)
Called Pt, LMOVM for rtrn call 

## 2018-04-20 NOTE — Telephone Encounter (Signed)
Patient called and lmom needing to come in for a headache that she ha had for about 3 days. She has taken everything she can. Please Call. Thanks

## 2018-04-21 NOTE — Telephone Encounter (Signed)
Called and LMOVM for Pt again.

## 2018-04-22 NOTE — Telephone Encounter (Signed)
Pt scheduled for appointment tomorrow.

## 2018-04-23 ENCOUNTER — Ambulatory Visit (INDEPENDENT_AMBULATORY_CARE_PROVIDER_SITE_OTHER): Payer: BC Managed Care – PPO | Admitting: Neurology

## 2018-04-23 DIAGNOSIS — G43719 Chronic migraine without aura, intractable, without status migrainosus: Secondary | ICD-10-CM | POA: Diagnosis not present

## 2018-04-23 MED ORDER — ONABOTULINUMTOXINA 100 UNITS IJ SOLR
155.0000 [IU] | Freq: Once | INTRAMUSCULAR | Status: AC
  Administered 2018-04-23: 155 [IU] via INTRAMUSCULAR

## 2018-04-23 NOTE — Progress Notes (Signed)
[facility]  ° °Procedure Note Botox ° °Attending: Dr. Baelyn Doring ° °Preoperative Diagnosis(es): Chronic migraine ° °Consent obtained from: The patient °Benefits discussed included, but were not limited to decreased muscle tightness, increased joint range of motion, and decreased pain.  Risk discussed included, but were not limited pain and discomfort, bleeding, bruising, excessive weakness, venous thrombosis, muscle atrophy and dysphagia.  Anticipated outcomes of the procedure as well as he risks and benefits of the alternatives to the procedure, and the roles and tasks of the personnel to be involved, were discussed with the patient, and the patient consents to the procedure and agrees to proceed. A copy of the patient medication guide was given to the patient which explains the blackbox warning. ° °Patients identity and treatment sites confirmed Yes.  . ° °Details of Procedure: °Skin was cleaned with alcohol. Prior to injection, the needle plunger was aspirated to make sure the needle was not within a blood vessel.  There was no blood retrieved on aspiration.   ° °Following is a summary of the muscles injected  And the amount of Botulinum toxin used: ° °Dilution °200 units of Botox was reconstituted with 4 ml of preservative free normal saline. °Time of reconstitution: At the time of the office visit (<30 minutes prior to injection)  ° °Injections  °155 total units of Botox was injected with a 30 gauge needle. ° °Injection Sites: °L occipitalis: 15 units- 3 sites  °R occiptalis: 15 units- 3 sites ° °L upper trapezius: 15 units- 3 sites °R upper trapezius: 15 units- 3 sits          °L paraspinal: 10 units- 2 sites °R paraspinal: 10 units- 2 sites ° °Face °L frontalis(2 injection sites):10 units   °R frontalis(2 injection sites):10 units         °L corrugator: 5 units   °R corrugator: 5 units           °Procerus: 5 units   °L temporalis: 20 units °R temporalis: 20 units  ° °Agent:  °200 units of botulinum Type  A (Onobotulinum Toxin type A) was reconstituted with 4 ml of preservative free normal saline.  °Time of reconstitution: At the time of the office visit (<30 minutes prior to injection)  ° ° ° Total injected (Units):  155 ° Total wasted (Units):  45 ° °Patient tolerated procedure well without complications.   °Reinjection is anticipated in 3 months. ° ° °

## 2018-04-25 ENCOUNTER — Other Ambulatory Visit: Payer: Self-pay | Admitting: Neurology

## 2018-05-15 ENCOUNTER — Ambulatory Visit: Payer: BC Managed Care – PPO | Admitting: Neurology

## 2018-05-15 ENCOUNTER — Encounter: Payer: Self-pay | Admitting: Neurology

## 2018-05-15 VITALS — BP 100/70 | HR 92 | Ht 61.5 in | Wt 127.0 lb

## 2018-05-15 DIAGNOSIS — M542 Cervicalgia: Secondary | ICD-10-CM

## 2018-05-15 DIAGNOSIS — G43009 Migraine without aura, not intractable, without status migrainosus: Secondary | ICD-10-CM

## 2018-05-15 MED ORDER — CYCLOBENZAPRINE HCL 10 MG PO TABS: 10.0000 mg | ORAL_TABLET | Freq: Three times a day (TID) | ORAL | 3 refills | Status: DC | PRN

## 2018-05-15 NOTE — Progress Notes (Signed)
NEUROLOGY FOLLOW UP OFFICE NOTE  JENISHA FAISON [id number]  HISTORY OF PRESENT ILLNESS: Krithi Bray is a 42 year old right-handed female with depression, anxiety, tobacco abuse, hypothyroidism and history of B12 deficiency who follows up for migraine.  She is accompanied by her son who supplements history.   UPDATE: Intensity:  3-4/10 Duration:  Until takes medicine, then quick Frequency:  4 to 5 days per month Current NSAIDS:  Naproxen (first line) Current analgesics:  no Current triptans:  Maxalt 10mg  (second line) Current anti-emetic:  no Current muscle relaxants:  Flexeril Current anti-anxiolytic:  alprazolam Current sleep aide:  no Current Antihypertensive medications:  no Current Antidepressant medications:  Lexapro Current Anticonvulsant medications:  no Current Vitamins/Herbal/Supplements:  B12 Current Antihistamines/Decongestants:  no Other therapy:  Botox  About 4 to 6 weeks ago, she reports a frequent "crick" in her neck, which she says will trigger her migraines.  She has been to Urgent Care where they had prescribed her prednisone and Valium, which helped.  She sees a Land, but the neck discomfort persists.  Depression/anxiety:  controlled   HISTORY: Onset:  3 years ago.  She was initially diagnosed with trigeminal neuralgia, as it occurred on the right side following a root canal.   Later, she was diagnosed with combination of trigeminal neuralgia and migraine.  Workup included MRI of brain and cervical spine. Location: Started at base of the skull, radiating to the ears, jaw and head.   Initially it was right sided, then became bilateral.  She also reports neck pain. Quality:  Constant pain.  It was not paroxysmal or electric Initial intensity:  7-8/10, sometimes up to 10/10 Aura:  no Prodrome:  no Associated symptoms:  When severe, she develops nausea.  Initial Duration:  Over a day Initial Frequency:  25 headache days per month (8-10 severe migraine  where she is incapacitated and nauseous)   Past abortive medication:  Advil, Aleve, Excedrin, Tylenol, Vicodin Past preventative medication:  Nortriptyline, amitriptyline, propranolol, atenolol, Cymbalta, Zoloft (all with side effects), topiramate 100mg    She had an MRI of the brain without and with contrast performed 11/15/12, which was normal.  PAST MEDICAL HISTORY: Past Medical History:  Diagnosis Date  . Hypothyroidism   . Migraines     MEDICATIONS: Current Outpatient Medications on File Prior to Visit  Medication Sig Dispense Refill  . acyclovir (ZOVIRAX) 400 MG tablet Take 400 mg by mouth 2 (two) times daily as needed.  10  . ALPRAZolam (XANAX) 0.25 MG tablet Take 0.25 mg by mouth every 12 (twelve) hours.  2  . cyanocobalamin (,VITAMIN B-12,) 1000 MCG/ML injection Inject 1 mL into the muscle every 14 (fourteen) days.  1  . escitalopram (LEXAPRO) 10 MG tablet Take 1 tablet by mouth daily.    [name] HYDROcodone-acetaminophen (NORCO/VICODIN) 5-325 MG tablet Take by mouth.    . levothyroxine (SYNTHROID, LEVOTHROID) 112 MCG tablet Take 1 tablet by mouth daily.    . naproxen sodium (ANAPROX) 550 MG tablet TAKE 1 TABLET BY MOUTH EVERY 12 HOURS AS NEEDED 20 tablet 1  . rizatriptan (MAXALT) 10 MG tablet TAKE 1 TABLET (10 MG TOTAL) BY MOUTH ONCE AS NEEDED. MAY TAKE A SECOND DOSE AFTER 2 HOURS IF NEEDED. 10 tablet 6  . Vitamin D, Ergocalciferol, (DRISDOL) 50000 units CAPS capsule Take 1 capsule by mouth once a week.  0   No current facility-administered medications on file prior to visit.     ALLERGIES: Allergies  Allergen Reactions  . Bupropion  Other (See Comments)    Nervousness  . Cephalexin Other (See Comments)    nervousness    FAMILY HISTORY: Family History  Problem Relation Age of Onset  . Osteoporosis Mother   . Atrial fibrillation Mother   . Heart disease Mother     SOCIAL HISTORY: Social History   Socioeconomic History  . Marital status: Married    Spouse name: Not  on file  . Number of children: Not on file  . Years of education: Not on file  . Highest education level: Not on file  Occupational History  . Not on file  Social Needs  . Financial resource strain: Not on file  . Food insecurity:    Worry: Not on file    Inability: Not on file  . Transportation needs:    Medical: Not on file    Non-medical: Not on file  Tobacco Use  . Smoking status: Current Some Day Smoker    Types: Cigarettes  . Smokeless tobacco: Never Used  . Tobacco comment: socially  Substance and Sexual Activity  . Alcohol use: Yes    Alcohol/week: 0.0 oz    Comment: socially  . Drug use: No  . Sexual activity: Not on file  Lifestyle  . Physical activity:    Days per week: Not on file    Minutes per session: Not on file  . Stress: Not on file  Relationships  . Social connections:    Talks on phone: Not on file    Gets together: Not on file    Attends religious service: Not on file    Active member of club or organization: Not on file    Attends meetings of clubs or organizations: Not on file    Relationship status: Not on file  . Intimate partner violence:    Fear of current or ex partner: Not on file    Emotionally abused: Not on file    Physically abused: Not on file    Forced sexual activity: Not on file  Other Topics Concern  . Not on file  Social History Narrative  . Not on file    REVIEW OF SYSTEMS: Constitutional: No fevers, chills, or sweats, no generalized fatigue, change in appetite Eyes: No visual changes, double vision, eye pain Ear, nose and throat: No hearing loss, ear pain, nasal congestion, sore throat Cardiovascular: No chest pain, palpitations Respiratory:  No shortness of breath at rest or with exertion, wheezes GastrointestinaI: No nausea, vomiting, diarrhea, abdominal pain, fecal incontinence Genitourinary:  No dysuria, urinary retention or frequency Musculoskeletal:  Neck pain Integumentary: No rash, pruritus, skin  lesions Neurological: as above Psychiatric: No depression, insomnia, anxiety Endocrine: No palpitations, fatigue, diaphoresis, mood swings, change in appetite, change in weight, increased thirst Hematologic/Lymphatic:  No purpura, petechiae. Allergic/Immunologic: no itchy/runny eyes, nasal congestion, recent allergic reactions, rashes  PHYSICAL EXAM: Vitals:   05/15/18 1102  BP: 100/70  Pulse: 92  SpO2: 96%   General: No acute distress.  Patient appears well-groomed.  normal body habitus. Head:  Normocephalic/atraumatic Eyes:  Fundi examined but not visualized Neck: supple, right sided paraspinal tenderness, full range of motion Heart:  Regular rate and rhythm Lungs:  Clear to auscultation bilaterally Back: No paraspinal tenderness Neurological Exam: alert and oriented to person, place, and time. Attention span and concentration intact, recent and remote memory intact, fund of knowledge intact.  Speech fluent and not dysarthric, language intact.  CN II-XII intact. Bulk and tone normal, muscle strength 5/5 throughout.  Sensation  to light touch intact.  Deep tendon reflexes 2+ throughout, toes downgoing.  Finger to nose and heel to shin testing intact.  Gait normal, Romberg negative.  IMPRESSION: Migraine without aura, without status migrainosus, not intractable Cervicalgia  PLAN: 1.  For neck pain, will refer to Dr. Antoine Primas at [facility] for OMM and other management 2.  Refill cyclobenzaprine 10mg , as it has been helpful in the past, may take up to 3 times daily as needed (cautioned for drowsiness) 3.  Naproxen and Maxalt for abortive therapy 4.  Limit pain relievers to no more than 2 days out of week to prevent rebound headache 5.  Headache diary 6.  Follow up for next Botox  Shon Millet, DO  CC:  Dr. Shary Decamp

## 2018-05-15 NOTE — Patient Instructions (Signed)
1.  For neck pain, take cyclobenzaprine 10mg .  May take up to 3 times daily as needed 2.  I will also refer you to Dr. Antoine PrimasZachary Smith at [facility] to evaluate and treat your neck pain 3.  Use naproxen and rizatriptan as needed 4.  Follow up for next Botox.

## 2018-05-18 ENCOUNTER — Encounter: Payer: Self-pay | Admitting: Internal Medicine

## 2018-07-23 ENCOUNTER — Ambulatory Visit (INDEPENDENT_AMBULATORY_CARE_PROVIDER_SITE_OTHER): Payer: BC Managed Care – PPO | Admitting: Neurology

## 2018-07-23 DIAGNOSIS — G43009 Migraine without aura, not intractable, without status migrainosus: Secondary | ICD-10-CM | POA: Diagnosis not present

## 2018-07-23 MED ORDER — ONABOTULINUMTOXINA 100 UNITS IJ SOLR
155.0000 [IU] | Freq: Once | INTRAMUSCULAR | Status: AC
  Administered 2018-07-23: 155 [IU] via INTRAMUSCULAR

## 2018-07-23 NOTE — Progress Notes (Signed)
[facility]   Procedure Note Botox  Attending: Dr. Jaffe  Preoperative Diagnosis(es): Chronic migraine  Consent obtained from: The patient. Benefits discussed included, but were not limited to decreased muscle tightness, increased joint range of motion, and decreased pain.  Risk discussed included, but were not limited pain and discomfort, bleeding, bruising, excessive weakness, venous thrombosis, muscle atrophy and dysphagia.  Anticipated outcomes of the procedure as well as he risks and benefits of the alternatives to the procedure, and the roles and tasks of the personnel to be involved, were discussed with the patient, and the patient consents to the procedure and agrees to proceed. A copy of the patient medication guide was given to the patient which explains the blackbox warning.  Patients identity and treatment sites confirmed:  Yes.  Details of Procedure: Skin was cleaned with alcohol. Prior to injection, the needle plunger was aspirated to make sure the needle was not within a blood vessel.  There was no blood retrieved on aspiration.    Following is a summary of the muscles injected  And the amount of Botulinum toxin used:  Dilution 200 units of Botox was reconstituted with 4 ml of preservative free normal saline. Time of reconstitution: At the time of the office visit (<30 minutes prior to injection)   Injections  155 total units of Botox was injected with a 30 gauge needle.  Injection Sites: L occipitalis: 15 units- 3 sites  R occiptalis: 15 units- 3 sites  L upper trapezius: 15 units- 3 sites R upper trapezius: 15 units- 3 sits          L paraspinal: 10 units- 2 sites R paraspinal: 10 units- 2 sites  Face L frontalis(2 injection sites):10 units   R frontalis(2 injection sites):10 units         L corrugator: 5 units   R corrugator: 5 units           Procerus: 5 units   L temporalis: 20 units R temporalis: 20 units   Agent:  200 units of botulinum Type A  (Onobotulinum Toxin type A) was reconstituted with 4 ml of preservative free normal saline.  Time of reconstitution: At the time of the office visit (<30 minutes prior to injection)     Total injected (Units): 155  Total wasted (Units): 45  Patient tolerated procedure well without complications.   Reinjection is anticipated in 3 months. Return to clinic in 4.5 months.   

## 2018-10-19 ENCOUNTER — Other Ambulatory Visit: Payer: Self-pay | Admitting: Neurology

## 2018-10-19 MED ORDER — ONABOTULINUMTOXINA 100 UNITS IJ SOLR: 200.0000 [IU] | INTRAMUSCULAR | 3 refills | Status: DC

## 2018-10-28 ENCOUNTER — Telehealth: Payer: Self-pay | Admitting: *Deleted

## 2018-10-28 NOTE — Telephone Encounter (Signed)
Kamarri called back stating that it was going to cost her thousands of dollars, her portion, $500 every three months, if she goes through the specialty pharmacy. She said she called [facility] and they will only be able to help with $800 so she must cancel. She stated she doesn't understand why it must be done that way because it was not done that way before and   I informed her that it had to be done that way because her insurance company stated so. I informed the front desk to cancel her Friday appointment per patient. She stated it os the only treatment that helps and I said I was sorry but maybe she and Dr. Everlena CooperJaffe can come up with a different way to manage her migraines.

## 2018-10-28 NOTE — Telephone Encounter (Signed)
Informed patient I spoke with [facility] to get the Botox mailed to us, they informed me she needs to set up an account. I took the number [phone] for her to call. I let her know they told me they had tried to contact her every business day since December 9th and asked me to give her this number if I contacted her. She wanted to know why I was contacting her this close to her appointment and I reiterated this process started on the 9th of December. I told her it has been approved they just need to set up an account for her. She asked why we are doing this this way and I read the letter to her that we received from the insurance company which she received as well it was dated November 26, 2017. She said they never did this like this before and I told her we must do this according to her insurance. We cannot use Botox from our pharmacy per her insurance instructions or they will not reimburse us. I also gave her the number to call [facility] to see if they are able to help with a copay.

## 2018-10-30 ENCOUNTER — Ambulatory Visit: Payer: BC Managed Care – PPO | Admitting: Neurology

## 2018-11-08 ENCOUNTER — Emergency Department (HOSPITAL_COMMUNITY)
Admission: EM | Admit: 2018-11-08 | Discharge: 2018-11-08 | Disposition: A | Discharge status: Home / Self Care | Payer: BC Managed Care – PPO | Attending: Emergency Medicine | Admitting: Emergency Medicine

## 2018-11-08 ENCOUNTER — Encounter (HOSPITAL_COMMUNITY): Payer: Self-pay | Admitting: Nurse Practitioner

## 2018-11-08 DIAGNOSIS — Z5321 Procedure and treatment not carried out due to patient leaving prior to being seen by health care provider: Secondary | ICD-10-CM | POA: Diagnosis not present

## 2018-11-08 DIAGNOSIS — H9319 Tinnitus, unspecified ear: Secondary | ICD-10-CM | POA: Diagnosis not present

## 2018-11-08 NOTE — ED Notes (Signed)
Bed: WA02 Expected date:  Expected time:  Means of arrival:  Comments: 

## 2018-11-08 NOTE — ED Triage Notes (Signed)
Pt is c/o ringing in the ears that has been ongoing for the last 6 days despite resolution of the URI symptoms she had presiding the tinnitus.

## 2018-11-08 NOTE — ED Notes (Signed)
Pt did not answer when called to reassess vitals. 

## 2018-11-09 ENCOUNTER — Telehealth: Payer: Self-pay | Admitting: Neurology

## 2018-11-09 NOTE — Telephone Encounter (Signed)
Called and spoke with Pt. I asked her if she has seen an ENT, she said she was going in to an appt with one now. She will let us know how the appt goes.

## 2018-11-09 NOTE — Telephone Encounter (Signed)
Patient stopped taking Botox b/c of copay $ and she is having a really bad ringing in her ear. She has been to Urgent Care and PCP and nothing is helping. Please call her at [phone]. Thanks!

## 2018-11-10 DIAGNOSIS — H9313 Tinnitus, bilateral: Secondary | ICD-10-CM | POA: Insufficient documentation

## 2018-11-12 ENCOUNTER — Telehealth: Payer: Self-pay | Admitting: Neurology

## 2018-11-12 NOTE — Telephone Encounter (Signed)
Patient went to the ENT on Monday and wants to let you know what was said and she still has the ringing in the ears please call

## 2018-11-12 NOTE — Telephone Encounter (Signed)
Patient is calling in again today wanting to know if the ringing in the ears was related to the migraines she Is also having. She also hasn't been having the BOTOX due to copay. Please give her a call back!

## 2018-11-13 NOTE — Telephone Encounter (Signed)
Called and spoke with Pt, she said she has tinnitus and it is causing her to have migraines. I advised her I can add her to our wait list to get in sooner to discuss. Pt will keep Feb appt.

## 2018-12-02 ENCOUNTER — Telehealth: Payer: Self-pay | Admitting: *Deleted

## 2018-12-02 NOTE — Telephone Encounter (Signed)
Received fax from [facility] "Medication Re-Order Form" : One of your patients is due for refill of their medication soon and has authorized your office to schedule delivery of their medication on their behalf. I called her and talked to her. She did not authorize it for the same reason she cancelled it before, she cannot afford the co-pay. She has an appointment scheduled 12/14/2018 and will talk to Dr. Everlena Cooper about how to proceed with her migraine management. I told her I will keep the letter until that appointment and if she does proceed with Botox I will fill in the blanks and have her Botox shipped to [place]. She agreed. I asked her to let Sandi know so I will know how to proceed.

## 2018-12-14 ENCOUNTER — Other Ambulatory Visit: Payer: Self-pay | Admitting: Neurology

## 2018-12-14 ENCOUNTER — Encounter: Payer: Self-pay | Admitting: Neurology

## 2018-12-14 ENCOUNTER — Ambulatory Visit: Payer: BC Managed Care – PPO | Admitting: Neurology

## 2018-12-14 VITALS — BP 98/76 | HR 89 | Ht 61.5 in | Wt 128.0 lb

## 2018-12-14 DIAGNOSIS — G43009 Migraine without aura, not intractable, without status migrainosus: Secondary | ICD-10-CM | POA: Diagnosis not present

## 2018-12-14 MED ORDER — SUMATRIPTAN 10 MG/ACT NA SOLN: 1.0000 | NASAL | 3 refills | Status: DC | PRN

## 2018-12-14 MED ORDER — ERENUMAB-AOOE 70 MG/ML ~~LOC~~ SOAJ: 70.0000 mg | Freq: Once | SUBCUTANEOUS | 0 refills | Status: AC

## 2018-12-14 MED ORDER — SUMATRIPTAN 10 MG/ACT NA SOLN: 10.0000 mg | NASAL | 0 refills | Status: DC

## 2018-12-14 MED ORDER — ERENUMAB-AOOE 70 MG/ML ~~LOC~~ SOAJ
70.0000 mg | SUBCUTANEOUS | 11 refills | Status: DC
Start: 2018-12-14 — End: 2019-10-22

## 2018-12-14 NOTE — Patient Instructions (Signed)
1.  For preventative management, we will start Aimovig 70mg  monthly 2.  Stop Maxalt.  For abortive therapy, use Tosymra nasal spray:  1 spray in one nostril.  May repeat after 1 hour.  Maximum 3 sprays in 24 hours. 3.  Limit use of pain relievers to no more than 2 days out of week to prevent risk of rebound or medication-overuse headache. 4.  Keep headache diary 5.  Exercise, hydration, caffeine cessation, sleep hygiene, monitor for and avoid triggers 6.  Consider:  magnesium citrate 400mg  daily, riboflavin 400mg  daily, and coenzyme Q10 100mg  three times daily 7.  Follow up in 4 months.

## 2018-12-14 NOTE — Progress Notes (Signed)
NEUROLOGY FOLLOW UP OFFICE NOTE  Alison Curry [id number]  HISTORY OF PRESENT ILLNESS: Alison Curry Macpherson is a 43 year old right-handed woman with depression, anxiety, hypothyroidism and tobacco use who follows up for migraine.  UPDATE: Last Botox was in September.  She stated that she was no longer able to afford the treatment.  Migraines have increased once a week.  They are not as intense.  One time, she had a severe migraine with visual aura (orbs).  She also has developed high-pitched ringing in the ears after developing an upper respiratory infection.  She saw ENT and everything is okay.  It is constant and may keep her up at night.  Intensity:  7/10 Duration:  12 hours Frequency:  Once a week Rescue protocol:  1st line Maxalt, 2nd line naproxen Current NSAIDS: Naproxen 500 mg Current analgesics:  Rarely hydrocodone Current triptans: Maxalt 10 mg Current ergotamine:  none Current anti-emetic:  None Current muscle relaxants:  none Current anti-anxiolytic: Xanax Current sleep aide:  none Current Antihypertensive medications:  none Current Antidepressant medications: Lexapro Current Anticonvulsant medications:  none Current anti-CGRP:  none Current Vitamins/Herbal/Supplements:  none Current Antihistamines/Decongestants:  none Other therapy: None Birth control/hormone:  None Other medication:  Adderall  Depression and anxiety: Controlled  HISTORY:  Onset: 2013. She was initially diagnosed with trigeminal neuralgia, as it occurred on the right side following a root canal. Later, she was diagnosed with combination of trigeminal neuralgia and migraine. Workup included MRI of brain and cervical spine. Location: Started at base of the skull, radiating to the ears, jaw and head. Initially it was right sided, then became bilateral. She also reports neck pain. Quality: Constant pain. It was not paroxysmal or electric Initial intensity: 7-8/10, sometimes up to 10/10 Aura:  no Prodrome: no Associated symptoms: Nausea when severe.  Initial Duration: Over a day Initial Frequency: 25 headache days per month (8-10 severe migraine where she is incapacitated and nauseous) Triggers: Unknown  Past abortive medication: Advil, Aleve, Excedrin, Tylenol, Vicodin Past preventative medication: Nortriptyline, amitriptyline, propranolol, atenolol, Cymbalta, Zoloft (all with side effects), topiramate 100mg , Botox (effective but no longer able to afford it)  She had an MRI of the brain without and with contrast performed 11/15/12, which was normal.  PAST MEDICAL HISTORY: Past Medical History:  Diagnosis Date  . Hypothyroidism   . Migraines     MEDICATIONS: Current Outpatient Medications on File Prior to Visit  Medication Sig Dispense Refill  . acyclovir (ZOVIRAX) 400 MG tablet Take 400 mg by mouth 2 (two) times daily as needed.  10  . ALPRAZolam (XANAX) 0.25 MG tablet Take 0.25 mg by mouth every 12 (twelve) hours.  2  . botulinum toxin Type A (BOTOX) 100 units SOLR injection Inject 200 Units into the muscle every 3 (three) months. 200 Units 3  . cyanocobalamin (,VITAMIN B-12,) 1000 MCG/ML injection Inject 1 mL into the muscle every 14 (fourteen) days.  1  . cyclobenzaprine (FLEXERIL) 10 MG tablet Take 1 tablet (10 mg total) by mouth 3 (three) times daily as needed for muscle spasms. 90 tablet 3  . escitalopram (LEXAPRO) 10 MG tablet Take 1 tablet by mouth daily.    [name]. HYDROcodone-acetaminophen (NORCO/VICODIN) 5-325 MG tablet Take by mouth.    . levothyroxine (SYNTHROID, LEVOTHROID) 112 MCG tablet Take 1 tablet by mouth daily.    . naproxen sodium (ANAPROX) 550 MG tablet TAKE 1 TABLET BY MOUTH EVERY 12 HOURS AS NEEDED 20 tablet 1  . rizatriptan (MAXALT) 10 MG tablet TAKE  1 TABLET (10 MG TOTAL) BY MOUTH ONCE AS NEEDED. MAY TAKE A SECOND DOSE AFTER 2 HOURS IF NEEDED. 10 tablet 6  . Vitamin D, Ergocalciferol, (DRISDOL) 50000 units CAPS capsule Take 1 capsule by mouth once  a week.  0   No current facility-administered medications on file prior to visit.     ALLERGIES: Allergies  Allergen Reactions  . Bupropion Other (See Comments)    Nervousness  . Cephalexin Other (See Comments)    nervousness    FAMILY HISTORY: Family History  Problem Relation Age of Onset  . Osteoporosis Mother   . Atrial fibrillation Mother   . Heart disease Mother    SOCIAL HISTORY: Social History   Socioeconomic History  . Marital status: Married    Spouse name: Not on file  . Number of children: Not on file  . Years of education: Not on file  . Highest education level: Not on file  Occupational History  . Not on file  Social Needs  . Financial resource strain: Not on file  . Food insecurity:    Worry: Not on file    Inability: Not on file  . Transportation needs:    Medical: Not on file    Non-medical: Not on file  Tobacco Use  . Smoking status: Current Some Day Smoker    Types: Cigarettes  . Smokeless tobacco: Never Used  . Tobacco comment: socially  Substance and Sexual Activity  . Alcohol use: Yes    Alcohol/week: 0.0 standard drinks    Comment: socially  . Drug use: No  . Sexual activity: Not on file  Lifestyle  . Physical activity:    Days per week: Not on file    Minutes per session: Not on file  . Stress: Not on file  Relationships  . Social connections:    Talks on phone: Not on file    Gets together: Not on file    Attends religious service: Not on file    Active member of club or organization: Not on file    Attends meetings of clubs or organizations: Not on file    Relationship status: Not on file  . Intimate partner violence:    Fear of current or ex partner: Not on file    Emotionally abused: Not on file    Physically abused: Not on file    Forced sexual activity: Not on file  Other Topics Concern  . Not on file  Social History Narrative  . Not on file    REVIEW OF SYSTEMS: Constitutional: No fevers, chills, or sweats, no  generalized fatigue, change in appetite Eyes: No visual changes, double vision, eye pain Ear, nose and throat: tinnitus Cardiovascular: No chest pain, palpitations Respiratory:  No shortness of breath at rest or with exertion, wheezes GastrointestinaI: No nausea, vomiting, diarrhea, abdominal pain, fecal incontinence Genitourinary:  No dysuria, urinary retention or frequency Musculoskeletal:  No neck pain, back pain Integumentary: No rash, pruritus, skin lesions Neurological: as above Psychiatric: No depression, insomnia, anxiety Endocrine: No palpitations, fatigue, diaphoresis, mood swings, change in appetite, change in weight, increased thirst Hematologic/Lymphatic:  No purpura, petechiae. Allergic/Immunologic: no itchy/runny eyes, nasal congestion, recent allergic reactions, rashes  PHYSICAL EXAM: Blood pressure 98/76, pulse 89, height 5' 1.5" (1.562 m), weight 128 lb (58.1 kg), SpO2 96 %. General: No acute distress.  Patient appears well-groomed.  Head:  Normocephalic/atraumatic Eyes:  Fundi examined but not visualized Neck: supple, no paraspinal tenderness, full range of motion Heart:  Regular  rate and rhythm Lungs:  Clear to auscultation bilaterally Back: No paraspinal tenderness Neurological Exam: alert and oriented to person, place, and time. Attention span and concentration intact, recent and remote memory intact, fund of knowledge intact.  Speech fluent and not dysarthric, language intact.  CN II-XII intact. Bulk and tone normal, muscle strength 5/5 throughout.  Sensation to light touch intact.  Deep tendon reflexes 2+ throughout, toes downgoing.  Finger to nose and heel to shin testing intact.  Gait normal, Romberg negative.  IMPRESSION: Migraine without aura, without status migrainosus, not intractable  PLAN: 1.  For preventative management, start Aimovig 70mg  monthly 2.  Stop Maxalt.  As migraines last so long, I think she needs a faster mode of administration.  For  abortive therapy, we will try Tosymra nasal spray.  3.  Limit use of pain relievers to no more than 2 days out of week to prevent risk of rebound or medication-overuse headache. 4.  Keep headache diary 5.  Exercise, hydration, caffeine cessation, sleep hygiene, monitor for and avoid triggers 6.  Consider:  magnesium citrate 400mg  daily, riboflavin 400mg  daily, and coenzyme Q10 100mg  three times daily 7.  Follow up in 4 months   Shon Millet, DO  CC: Feliciana Rossetti, MD

## 2018-12-16 ENCOUNTER — Other Ambulatory Visit: Payer: Self-pay | Admitting: Neurology

## 2018-12-16 NOTE — Progress Notes (Signed)
Prior Authorization initiated via [email or web address] for pt's   Aimovig 70mg/mL 

## 2018-12-17 NOTE — Progress Notes (Signed)
Aimovig approved 12/16/2018 - 03/16/2019

## 2019-03-08 ENCOUNTER — Other Ambulatory Visit: Payer: Self-pay | Admitting: Neurology

## 2019-03-08 NOTE — Telephone Encounter (Signed)
yes

## 2019-03-08 NOTE — Telephone Encounter (Signed)
Will approve

## 2019-03-31 DIAGNOSIS — E782 Mixed hyperlipidemia: Secondary | ICD-10-CM | POA: Insufficient documentation

## 2019-04-07 NOTE — Progress Notes (Signed)
Alison Curry (Key: [email or web address])  Aimovig 70MG /ML auto-injectors  Form [facility] Non-Medicare Clinical Prior Authorization Criteria General Request Form  Plan Contact [phone] phone  [phone] fax

## 2019-04-20 ENCOUNTER — Encounter: Payer: Self-pay | Admitting: Neurology

## 2019-04-20 NOTE — Progress Notes (Signed)
Virtual Visit via Video Note The purpose of this virtual visit is to provide medical care while limiting exposure to the novel coronavirus.    Consent was obtained for video visit:  Yes Answered questions that patient had about telehealth interaction:  Yes I discussed the limitations, risks, security and privacy concerns of performing an evaluation and management service by telemedicine. I also discussed with the patient that there may be a patient responsible charge related to this service. The patient expressed understanding and agreed to proceed.  Pt location: Home Physician Location: office Name of referring provider:  Raina Mina., MD I connected with Alison Curry at patients initiation/request on 04/21/2019 at  3:30 PM EDT by video enabled telemedicine application and verified that I am speaking with the correct person using two identifiers. Pt MRN:  [id number] Pt DOB:  05/18/1976 Video Participants:  Alison Curry;    History of Present Illness:  Alison Curry is a 43 year old right-handed woman who follows up for migraines.  UPDATE: Since Botox was cancelled due to COVID-19, she had an increase in headaches.  They were intractable and no longer responding to Maxalt, so I prescribed her Tosymra.  She has not tried it yet.  She has had significant improvement in migraines with Aimovig, doing about as well as on Botox.  Responding to Maxalt again.  Intensity:  7-8/10 Duration:  Until goes to sleep Frequency:  1 to 2 days a month Frequency of abortive medication: 1 to 2 days a month Rescue protocol:  1st line Maxalt, 2nd line naproxen Current NSAIDS: Naproxen 500 mg Current analgesics:  Rarely hydrocodone Current triptans: Maxalt, Tosymra (has not taken yet) Current ergotamine:  none Current anti-emetic:  None Current muscle relaxants:  Flexeril Current anti-anxiolytic: Xanax Current sleep aide:  none Current Antihypertensive medications:  none Current Antidepressant  medications: Lexapro Current Anticonvulsant medications:  none Current anti-CGRP:  Aimovig 70mg  Current Vitamins/Herbal/Supplements:  none Current Antihistamines/Decongestants:  none Other therapy: None Birth control/hormone:  None Other medication:  Adderall  Depression and anxiety:  Controlled  HISTORY:  Onset: 2013.She was initially diagnosed with trigeminal neuralgia, as it occurred on the right side following a root canal.Later, she was diagnosed with combination of trigeminal neuralgia and migraine.Workup included MRI of brain and cervical spine. Location: Started at base of the skull, radiating to the ears, jaw and head.Initially it was right sided, then became bilateral.She also reports neck pain. Quality:Constant pain.It was not paroxysmal or electric Initial intensity:7-8/10, sometimes up to 10/10 Aura:sometimes sees orbs Prodrome:no Postdrome:  Head is sore/scalp tingles Associated symptoms: Nausea when severe.  Initial Duration:Over a day Initial Frequency:25 headache days per month (8-10 severe migraine where she is incapacitated and nauseous) Triggers: Unknown  Past abortive medication:Advil, Aleve, Excedrin, Tylenol, Vicodin, Maxalt Past preventative medication:Nortriptyline, amitriptyline, propranolol, atenolol, Cymbalta, Zoloft (all with side effects), topiramate 100mg , Botox (effective but no longer able to afford it)  She had an MRI of the brain without and with contrast performed 11/15/12, which was normal.  Past Medical History: Past Medical History:  Diagnosis Date  . Hypothyroidism   . Migraines     Medications: Outpatient Encounter Medications as of 04/21/2019  Medication Sig Note  . acyclovir (ZOVIRAX) 400 MG tablet Take 400 mg by mouth 2 (two) times daily as needed. 07/21/2015: Received from: External Pharmacy Received Sig: Take 400 mg by mouth 2 (two) times daily.  [name] ALPRAZolam (XANAX) 0.25 MG tablet Take 0.25 mg by mouth  every 12 (twelve)  hours. 08/23/2015: Received from: External Pharmacy  . amphetamine-dextroamphetamine (ADDERALL XR) 25 MG 24 hr capsule Take 25 mg by mouth every morning.   . cyanocobalamin (,VITAMIN B-12,) 1000 MCG/ML injection Inject 1 mL into the muscle every 14 (fourteen) days. 07/21/2015: Received from: External Pharmacy Received Sig: INJECT 1 ML INTRAMUSCULARLY TWICE A MONTH  . cyclobenzaprine (FLEXERIL) 10 MG tablet Take 1 tablet (10 mg total) by mouth 3 (three) times daily as needed for muscle spasms.   Dorise Hiss. Erenumab-aooe (AIMOVIG) 70 MG/ML SOAJ Inject 70 mg into the skin every 30 (thirty) days.   [name]. escitalopram (LEXAPRO) 10 MG tablet Take 1 tablet by mouth daily. 07[facility]21[facility]2015: [facility] Received Sig: Take by mouth.  [name]. HYDROcodone-acetaminophen (NORCO/VICODIN) 5-325 MG tablet Take by mouth.   . levothyroxine (SYNTHROID, LEVOTHROID) 112 MCG tablet Take 1 tablet by mouth daily. 07/21/2015: [facility] Received Sig: Take by mouth.  . naproxen sodium (ANAPROX) 550 MG tablet TAKE 1 TABLET BY MOUTH EVERY 12 HOURS AS NEEDED   . rizatriptan (MAXALT) 10 MG tablet TAKE 1 TABLET (10 MG TOTAL) BY MOUTH ONCE AS NEEDED. MAY TAKE A SECOND DOSE AFTER 2 HOURS IF NEEDED   . SUMAtriptan (TOSYMRA) 10 MG/ACT SOLN Place 1 spray into the nose every hour as needed (maximum 3 sprays in 24 hours).   . Vitamin D, Ergocalciferol, (DRISDOL) 50000 units CAPS capsule Take 1 capsule by mouth once a week.   . [DISCONTINUED] SUMAtriptan (TOSYMRA) 10 MG/ACT SOLN Place 10 mg into the nose as directed.    No facility-administered encounter medications on file as of 04/21/2019.     Allergies: Allergies  Allergen Reactions  . Bupropion Other (See Comments)    Nervousness  . Cephalexin Other (See Comments)    nervousness    Family History: Family History  Problem Relation Age of Onset  . Osteoporosis Mother   . Atrial fibrillation Mother   . Heart disease  Mother     Social History: Social History   Socioeconomic History  . Marital status: Married    Spouse name: Not on file  . Number of children: Not on file  . Years of education: Not on file  . Highest education level: Not on file  Occupational History  . Not on file  Social Needs  . Financial resource strain: Not on file  . Food insecurity:    Worry: Not on file    Inability: Not on file  . Transportation needs:    Medical: Not on file    Non-medical: Not on file  Tobacco Use  . Smoking status: Current Some Day Smoker    Types: Cigarettes  . Smokeless tobacco: Never Used  . Tobacco comment: socially  Substance and Sexual Activity  . Alcohol use: Yes    Alcohol/week: 0.0 standard drinks    Comment: socially  . Drug use: No  . Sexual activity: Not on file  Lifestyle  . Physical activity:    Days per week: Not on file    Minutes per session: Not on file  . Stress: Not on file  Relationships  . Social connections:    Talks on phone: Not on file    Gets together: Not on file    Attends religious service: Not on file    Active member of club or organization: Not on file    Attends meetings of clubs or organizations: Not on file    Relationship status: Not on file  .  Intimate partner violence:    Fear of current or ex partner: Not on file    Emotionally abused: Not on file    Physically abused: Not on file    Forced sexual activity: Not on file  Other Topics Concern  . Not on file  Social History Narrative   Right handed   Lives in single story home with 2 sons   Observations/Objective:   Vitals:   04/20/19 1509  Weight: 125 lb (56.7 kg)  Height: 5\' 1"  (1.549 m)  no acute distress.  Alert and oriented.  Speech fluent.  Language intact.  Face symmetric.    Assessment and Plan:   Migraine without (sometimes with) aura, without status migrainosus, not intractable.  She is doing well on Aimovig and wishes to continue instead of restarting Botox.  1.  For  preventative management, Aimovig 70mg  2.  For abortive therapy, Maxalt with naproxen.  Has Tosymra if needed. 3.  Limit use of pain relievers to no more than 2 days out of week to prevent risk of rebound or medication-overuse headache. 4.  Keep headache diary 5.  Exercise, hydration, caffeine cessation, sleep hygiene, monitor for and avoid triggers 6.  Consider:  magnesium citrate 400mg  daily, riboflavin 400mg  daily, and coenzyme Q10 100mg  three times daily 7. Always keep in mind that currently taking a hormone or birth control may be a possible trigger or aggravating factor for migraine. 8. Follow up 6 months.   Follow Up Instructions:    -I discussed the assessment and treatment plan with the patient. The patient was provided an opportunity to ask questions and all were answered. The patient agreed with the plan and demonstrated an understanding of the instructions.   The patient was advised to call back or seek an in-person evaluation if the symptoms worsen or if the condition fails to improve as anticipated.   Cira ServantAdam Robert Yunique Dearcos, DO

## 2019-04-21 ENCOUNTER — Other Ambulatory Visit: Payer: Self-pay

## 2019-04-21 ENCOUNTER — Telehealth (INDEPENDENT_AMBULATORY_CARE_PROVIDER_SITE_OTHER): Payer: BC Managed Care – PPO | Admitting: Neurology

## 2019-04-21 ENCOUNTER — Encounter: Payer: Self-pay | Admitting: Neurology

## 2019-04-21 VITALS — Ht 61.0 in | Wt 125.0 lb

## 2019-04-21 DIAGNOSIS — G43009 Migraine without aura, not intractable, without status migrainosus: Secondary | ICD-10-CM

## 2019-04-29 ENCOUNTER — Telehealth: Payer: Self-pay

## 2019-04-29 NOTE — Telephone Encounter (Signed)
Spoke with patient to see if she wanted [place] to do prior authorization for botox.  She says she is doing well on Aimovig and will pass on botox for now.

## 2019-06-21 ENCOUNTER — Telehealth: Payer: Self-pay | Admitting: Neurology

## 2019-06-21 NOTE — Telephone Encounter (Signed)
Left message with after hour service 06-21-19 @ 12:08 pm    Calling about a prior auth for a medication

## 2019-06-25 NOTE — Progress Notes (Unsigned)
Submitted on cover My Meds Key: [id number]

## 2019-06-25 NOTE — Telephone Encounter (Signed)
Out of medication for a week. She Is needing PA on the Aimovig to the [facility] in [place].

## 2019-06-25 NOTE — Telephone Encounter (Signed)
Called Pt, advised her a PA has been submitted, waiting on determination. Advised her she can go to [email or web address] and register and pick up the Rx for no more than $5.

## 2019-07-22 DIAGNOSIS — R5381 Other malaise: Secondary | ICD-10-CM | POA: Insufficient documentation

## 2019-07-22 DIAGNOSIS — D509 Iron deficiency anemia, unspecified: Secondary | ICD-10-CM | POA: Insufficient documentation

## 2019-07-27 ENCOUNTER — Encounter: Payer: Self-pay | Admitting: Gastroenterology

## 2019-07-29 ENCOUNTER — Ambulatory Visit (INDEPENDENT_AMBULATORY_CARE_PROVIDER_SITE_OTHER): Payer: BC Managed Care – PPO | Admitting: Gastroenterology

## 2019-07-29 ENCOUNTER — Other Ambulatory Visit: Payer: Self-pay

## 2019-07-29 ENCOUNTER — Encounter: Payer: Self-pay | Admitting: Gastroenterology

## 2019-07-29 VITALS — BP 120/78 | HR 70 | Temp 98.5°F | Ht 61.0 in | Wt 132.0 lb

## 2019-07-29 DIAGNOSIS — Z8619 Personal history of other infectious and parasitic diseases: Secondary | ICD-10-CM | POA: Diagnosis not present

## 2019-07-29 DIAGNOSIS — K219 Gastro-esophageal reflux disease without esophagitis: Secondary | ICD-10-CM

## 2019-07-29 DIAGNOSIS — R1013 Epigastric pain: Secondary | ICD-10-CM | POA: Diagnosis not present

## 2019-07-29 DIAGNOSIS — K581 Irritable bowel syndrome with constipation: Secondary | ICD-10-CM

## 2019-07-29 MED ORDER — AMBULATORY NON FORMULARY MEDICATION: 0 refills | Status: DC

## 2019-07-29 MED ORDER — LINACLOTIDE 72 MCG PO CAPS: 72.0000 ug | ORAL_CAPSULE | Freq: Every day | ORAL | 0 refills | Status: DC

## 2019-07-29 MED ORDER — PANTOPRAZOLE SODIUM 40 MG PO TBEC: 40.0000 mg | DELAYED_RELEASE_TABLET | Freq: Two times a day (BID) | ORAL | 11 refills | Status: DC

## 2019-07-29 NOTE — Patient Instructions (Addendum)
If you are age 45 or older, your body mass index should be between 23-30. Your Body mass index is 24.94 kg/m. If this is out of the aforementioned range listed, please consider follow up with your Primary Care Provider.  If you are age 57 or younger, your body mass index should be between 19-25. Your Body mass index is 24.94 kg/m. If this is out of the aformentioned range listed, please consider follow up with your Primary Care Provider.   We have sent the following medications to your pharmacy for you to pick up at your convenience: GI cocktail Protonix Linzess  You have been scheduled for an endoscopy. Please follow written instructions given to you at your visit today. If you use inhalers (even only as needed), please bring them with you on the day of your procedure. Your physician has requested that you go to www.[email or web address] and enter the access code given to you at your visit today. This web site gives a general overview about your procedure. However, you should still follow specific instructions given to you by our office regarding your preparation for the procedure.  Thank you,  Dr. Jackquline Denmark

## 2019-07-29 NOTE — Progress Notes (Signed)
Chief Complaint:   Referring Provider:  Raina Mina., MD      ASSESSMENT AND PLAN;   #1. GERD #2. H/O Ritta Slot (after UTI). Treeated with dilfucan and nystatin #3. Epi pain. [place] neg ([facility] in ED) #4. IBS with predom constipation.  Plan: -Please obtain previous records [facility] ED-ultrasound and labs. -Increased Protonix 40mg  po bid , 60, 11 refills -Proceed with EGD. -GI cocktail 15 cc po Q6hrs as needed.  Equal amounts of Maalox, liquid Donnatal/Bentyl and viscous lidocaine) #120 ml, 2 refills. -Linzess 53mcg po qd (samples given). -Increase water intake.    HPI:    Alison Curry is a 43 y.o. female  Upper abdo pain with associated heartburn, nausea, decreased appetite, abdominal bloating, belching. Seen in ED at [facility].  We do not have records at the present time.  She apparently had normal labs, underwent ultrasound of the abdomen which was unremarkable.  Started on Protonix 40 mg p.o. once a day with some relief.  She was advised to follow-up in the GI clinic.  Also had oral thrush after getting antibiotics for UTI, treated briefly with Diflucan and then nystatin swishes and swallows with some relief.  Still feels like "dry esophagus".  She denies having any odynophagia or dysphagia.  Does complain of sore throat.  Has longstanding history of chronic constipation with pellet-like stools, abdominal bloating, lower abdominal discomfort which gets better with defecation.  Has been taking Maxide trait off and on.  Denies having any weight loss.  Past GI procedures: -EGD 01/2002-mild Candida esophagitis, mild gastritis.  Treated with Diflucan.  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 Anxiety and depression   . GERD (gastroesophageal reflux disease)   . Hypothyroidism   . Menometrorrhagia   . Migraines   . PID (pelvic inflammatory disease)     Past Surgical History:  Procedure Laterality Date  . ABLATION    . CARPAL TUNNEL  RELEASE Right   . CESAREAN SECTION     x 2  . ESOPHAGOGASTRODUODENOSCOPY  02/01/2002   Mild Candida esophagitis. Mild gastritis.   . TOOTH EXTRACTION    . Tummy Tuck      Family History  Problem Relation Age of Onset  . Osteoporosis Mother   . Atrial fibrillation Mother   . Heart disease Mother   . Colon cancer Neg Hx     Social History   Tobacco Use  . Smoking status: Former Smoker    Types: Cigarettes    Quit date: 07/28/2009    Years since quitting: 10.0  . Smokeless tobacco: Never Used  . Tobacco comment: socially  Substance Use Topics  . Alcohol use: Yes    Alcohol/week: 0.0 standard drinks    Comment: socially  . Drug use: No    Current Outpatient Medications  Medication Sig Dispense Refill  . acyclovir (ZOVIRAX) 400 MG tablet Take 400 mg by mouth 2 (two) times daily as needed.  10  . ALPRAZolam (XANAX) 0.25 MG tablet Take 0.25 mg by mouth every 12 (twelve) hours.  2  . amphetamine-dextroamphetamine (ADDERALL XR) 25 MG 24 hr capsule Take 25 mg by mouth every morning.    . cyanocobalamin (,VITAMIN B-12,) 1000 MCG/ML injection Inject 1 mL into the muscle every 14 (fourteen) days.  1  . cyclobenzaprine (FLEXERIL) 10 MG tablet Take 1 tablet (10 mg total) by mouth 3 (three) times daily as needed for muscle spasms. 90 tablet 3  .  Erenumab-aooe (AIMOVIG) 70 MG/ML SOAJ Inject 70 mg into the skin every 30 (thirty) days. 1 pen 11  . escitalopram (LEXAPRO) 10 MG tablet Take 1 tablet by mouth daily.    [name]. HYDROcodone-acetaminophen (NORCO/VICODIN) 5-325 MG tablet Take by mouth.    . levothyroxine (SYNTHROID, LEVOTHROID) 112 MCG tablet Take 1 tablet by mouth daily.    . naproxen sodium (ANAPROX) 550 MG tablet TAKE 1 TABLET BY MOUTH EVERY 12 HOURS AS NEEDED 20 tablet 1  . rizatriptan (MAXALT) 10 MG tablet TAKE 1 TABLET (10 MG TOTAL) BY MOUTH ONCE AS NEEDED. MAY TAKE A SECOND DOSE AFTER 2 HOURS IF NEEDED 10 tablet 6  . SUMAtriptan (TOSYMRA) 10 MG/ACT SOLN Place 1 spray into the nose  every hour as needed (maximum 3 sprays in 24 hours). 9 each 3  . Vitamin D, Ergocalciferol, (DRISDOL) 50000 units CAPS capsule Take 1 capsule by mouth once a week.  0   No current facility-administered medications for this visit.     Allergies  Allergen Reactions  . Bupropion Other (See Comments)    Nervousness  . Cephalexin Other (See Comments)    nervousness    Review of Systems:  Constitutional: Denies fever, chills, diaphoresis, appetite change and has fatigue.  HEENT: Denies photophobia, eye pain, redness, hearing loss, ear pain, congestion, sore throat, rhinorrhea, sneezing, mouth sores, neck pain, neck stiffness and tinnitus.  Has sore throat. Respiratory: Denies SOB, DOE, cough, chest tightness,  and wheezing.   Cardiovascular: Denies chest pain, palpitations and leg swelling.  Genitourinary: Denies dysuria, urgency, frequency, hematuria, flank pain and difficulty urinating.  Musculoskeletal: Denies myalgias, back pain, joint swelling, arthralgias and gait problem.  Skin: No rash.  Neurological: Denies dizziness, seizures, syncope, weakness, light-headedness, numbness and has headaches.  Hematological: Denies adenopathy. Easy bruising, personal or family bleeding history  Psychiatric/Behavioral: Has anxiety or depression     Physical Exam:    BP 120/78   Pulse 70   Temp 98.5 F (36.9 C)   Ht 5\' 1"  (1.549 m)   Wt 132 lb (59.9 kg)   SpO2 94%   BMI 24.94 kg/m  Filed Weights   07/29/19 1502  Weight: 132 lb (59.9 kg)   Constitutional:  Well-developed, in no acute distress. Psychiatric: Normal mood and affect. Behavior is normal. HEENT: Pupils normal.  Conjunctivae are normal. No scleral icterus.  No definite thrush. Neck supple.  Cardiovascular: Normal rate, regular rhythm. No edema Pulmonary/chest: Effort normal and breath sounds normal. No wheezing, rales or rhonchi. Abdominal: Soft, nondistended. Nontender. Bowel sounds active throughout. There are no masses  palpable. No hepatomegaly. Rectal:  defered Neurological: Alert and oriented to person place and time. Skin: Skin is warm and dry. No rashes noted.  Data Reviewed: I have personally reviewed following labs and imaging studies   Edman Circleaj Aarti Mankowski, MD 07/29/2019, 3:23 PM  Cc: Gordan PaymentGrisso, Greg A., MD

## 2019-07-30 ENCOUNTER — Ambulatory Visit (AMBULATORY_SURGERY_CENTER): Payer: BC Managed Care – PPO | Admitting: Gastroenterology

## 2019-07-30 ENCOUNTER — Other Ambulatory Visit: Payer: Self-pay | Admitting: Gastroenterology

## 2019-07-30 ENCOUNTER — Encounter: Payer: Self-pay | Admitting: Gastroenterology

## 2019-07-30 VITALS — BP 104/65 | HR 53 | Temp 97.8°F | Resp 19 | Ht 61.0 in | Wt 132.0 lb

## 2019-07-30 DIAGNOSIS — K2970 Gastritis, unspecified, without bleeding: Secondary | ICD-10-CM | POA: Diagnosis not present

## 2019-07-30 DIAGNOSIS — K219 Gastro-esophageal reflux disease without esophagitis: Secondary | ICD-10-CM

## 2019-07-30 DIAGNOSIS — K3189 Other diseases of stomach and duodenum: Secondary | ICD-10-CM | POA: Diagnosis not present

## 2019-07-30 MED ORDER — SODIUM CHLORIDE 0.9 % IV SOLN: 500.0000 mL | Freq: Once | INTRAVENOUS | Status: DC

## 2019-07-30 NOTE — Patient Instructions (Signed)
YOU HAD AN ENDOSCOPIC PROCEDURE TODAY AT [facility]:   Refer to the procedure report that was given to you for any specific questions about what was found during the examination.  If the procedure report does not answer your questions, please call your gastroenterologist to clarify.  If you requested that your care partner not be given the details of your procedure findings, then the procedure report has been included in a sealed envelope for you to review at your convenience later.  YOU SHOULD EXPECT: Some feelings of bloating in the abdomen. Passage of more gas than usual.  Walking can help get rid of the air that was put into your GI tract during the procedure and reduce the bloating.  Please Note:  You might notice some irritation and congestion in your nose or some drainage.  This is from the oxygen used during your procedure.  There is no need for concern and it should clear up in a day or so.  SYMPTOMS TO REPORT IMMEDIATELY:   Following upper endoscopy (EGD)  Vomiting of blood or coffee ground material  New chest pain or pain under the shoulder blades  Painful or persistently difficult swallowing  New shortness of breath  Fever of 100F or higher  Black, tarry-looking stools  For urgent or emergent issues, a gastroenterologist can be reached at any hour by calling [phone].   DIET:  We do recommend a small meal at first, but then you may proceed to your regular diet.  Drink plenty of fluids but you should avoid alcoholic beverages for 24 hours.  ACTIVITY:  You should plan to take it easy for the rest of today and you should NOT DRIVE or use heavy machinery until tomorrow (because of the sedation medicines used during the test).    FOLLOW UP: Our staff will call the number listed on your records 48-72 hours following your procedure to check on you and address any questions or concerns that you may have regarding the information given to you following your  procedure. If we do not reach you, we will leave a message.  We will attempt to reach you two times.  During this call, we will ask if you have developed any symptoms of COVID 19. If you develop any symptoms (ie: fever, flu-like symptoms, shortness of breath, cough etc.) before then, please call [phone].  If you test positive for Covid 19 in the 2 weeks post procedure, please call and report this information to [place].    If any biopsies were taken you will be contacted by phone or by letter within the next 1-3 weeks.  Please call us at [phone] if you have not heard about the biopsies in 3 weeks.    SIGNATURES/CONFIDENTIALITY: You and/or your care partner have signed paperwork which will be entered into your electronic medical record.  These signatures attest to the fact that that the information above on your After Visit Summary has been reviewed and is understood.  Full responsibility of the confidentiality of this discharge information lies with you and/or your care-partner.  Please read over handout about gastritis  No Aspirin, Ibuprofen, Naproxen or other Non-steroidal anti-inflammatory drugs- Tylenol is ok  Continue your Protonix 40 mg twice daily  Dr. Steve Rattler office will call you to set up a follow up office visit for 12 weeks

## 2019-07-30 NOTE — Progress Notes (Signed)
Pt's states no medical or surgical changes since previsit or office visit.  KA temps, DC IV and CW vitals.

## 2019-07-30 NOTE — Progress Notes (Signed)
Report to PACU, RN, vss, BBS= Clear.  

## 2019-07-30 NOTE — Progress Notes (Signed)
Called to room to assist during endoscopic procedure.  Patient ID and intended procedure confirmed with present staff. Received instructions for my participation in the procedure from the performing physician.  

## 2019-07-30 NOTE — Op Note (Signed)
[facility] Patient Name: Alison Curry Procedure Date: 07/30/2019 9:36 AM MRN: [id number] Endoscopist: Jackquline Denmark , MD Age: 43 Referring MD:  Date of Birth: [birth year] Gender: Female Account #: [id number] Procedure:                Upper GI endoscopy Indications:              #1. GERD                           #2. H/O Thrush (after UTI). Treeated with dilfucan                            and nystatin                           #3. Epi pain. [place] neg (RH in ED) Medicines:                Monitored Anesthesia Care Procedure:                Pre-Anesthesia Assessment:                           - Prior to the procedure, a History and Physical                            was performed, and patient medications and                            allergies were reviewed. The patient's tolerance of                            previous anesthesia was also reviewed. The risks                            and benefits of the procedure and the sedation                            options and risks were discussed with the patient.                            All questions were answered, and informed consent                            was obtained. Prior Anticoagulants: The patient has                            taken no previous anticoagulant or antiplatelet                            agents. ASA Grade Assessment: II - A patient with                            mild systemic disease. After reviewing the risks  and benefits, the patient was deemed in                            satisfactory condition to undergo the procedure.                           After obtaining informed consent, the endoscope was                            passed under direct vision. Throughout the                            procedure, the patient's blood pressure, pulse, and                            oxygen saturations were monitored continuously. The                            Endoscope was introduced  through the mouth, and                            advanced to the second part of duodenum. The upper                            GI endoscopy was accomplished without difficulty.                            The patient tolerated the procedure well. Scope In: Scope Out: Findings:                 The examined esophagus was normal. Biopsies were                            obtained from the proximal and distal esophagus                            with cold forceps for histology of suspected                            eosinophilic esophagitis.                           The Z-line was regular and was found 36 cm from the                            incisors.                           Localized moderate inflammation characterized by                            erythema was found in the gastric antrum. Biopsies                            were taken with a cold forceps for  histology.                            Estimated blood loss: none.                           The examined duodenum was normal. Biopsies for                            histology were taken with a cold forceps for                            evaluation of celiac disease. Estimated blood loss:                            none. Complications:            No immediate complications. Estimated Blood Loss:     Estimated blood loss: none. Impression:               -Moderate gastritis. Recommendation:           - Patient has a contact number available for                            emergencies. The signs and symptoms of potential                            delayed complications were discussed with the                            patient. Return to normal activities tomorrow.                            Written discharge instructions were provided to the                            patient.                           - Resume previous diet.                           - Protonix 40 mg p.o. twice daily.                           - Await pathology  results.                           - No aspirin, ibuprofen, naproxen, or other                            non-steroidal anti-inflammatory drugs.                           - Return to GI clinic in 12 weeks. Lynann Bolognaajesh Monigue Spraggins, MD 07/30/2019 9:56:02 AM This report has been signed electronically.

## 2019-08-02 ENCOUNTER — Telehealth: Payer: Self-pay | Admitting: Gastroenterology

## 2019-08-02 DIAGNOSIS — R1013 Epigastric pain: Secondary | ICD-10-CM

## 2019-08-02 DIAGNOSIS — K219 Gastro-esophageal reflux disease without esophagitis: Secondary | ICD-10-CM

## 2019-08-02 NOTE — Telephone Encounter (Signed)
Called and spoke with patient-patient reports she is still having symptoms-burning in my chest/throat/ sharp pains below rib cage in abdomen/is taking Protonix BID/GI cocktail every 6 hrs (not lasting more than 3 hrs for relief of symptoms)-  Patient is requesting to know if she can take the GI cocktail every 4 hours instead of 6hrs?  Burping "a lot", stomach bloated, not really any changes from prior to EGD; patient is able to have a bowel movement; not using any other OTC meds;  Patient is requesting symptom relief-

## 2019-08-02 NOTE — Telephone Encounter (Signed)
Pt inquired about EGD results.  

## 2019-08-02 NOTE — Telephone Encounter (Signed)
Change Protonix to Dexilant 60 mg p.o. once a day, 30, 6 refills.  Please send her cards/coupons Have to give it at least 2 weeks We are waiting for the biopsies as well Continue rest of the medications as before Lets just use GI cocktail every 6 hours PRN. Hopefully, don't have to use much once Dexilant kicks in  RG

## 2019-08-03 ENCOUNTER — Telehealth: Payer: Self-pay

## 2019-08-03 MED ORDER — DEXLANSOPRAZOLE 60 MG PO CPDR: 60.0000 mg | DELAYED_RELEASE_CAPSULE | Freq: Every day | ORAL | 6 refills | Status: DC

## 2019-08-03 NOTE — Telephone Encounter (Signed)
Called and spoke with patient-patient informed of MD recommendations; patient is agreeable with plan of care and RX has been sent to pharmacy of patient choice; Patient verbalized understanding of information/instructions;  Patient was advised to call the office at [phone] if questions/concerns arise;

## 2019-08-03 NOTE — Telephone Encounter (Signed)
  Follow up Call-  Call back number 07/30/2019  Post procedure Call Back phone  # [phone]  Permission to leave phone message Yes  Some recent data might be hidden     Patient questions:  Do you have a fever, pain , or abdominal swelling? No. Pain Score  0 *  Have you tolerated food without any problems? Yes.    Have you been able to return to your normal activities? Yes.    Do you have any questions about your discharge instructions: Diet   No. Medications  No. Follow up visit  No.  Do you have questions or concerns about your Care? No.  Actions: * If pain score is 4 or above: 1. No action needed, pain <4.Have you developed a fever since your procedure? no  2.   Have you had an respiratory symptoms (SOB or cough) since your procedure? no  3.   Have you tested positive for COVID 19 since your procedure no  4.   Have you had any family members/close contacts diagnosed with the COVID 19 since your procedure?  no   If yes to any of these questions please route to Joylene John, RN and Alphonsa Gin, Therapist, sports.

## 2019-08-04 ENCOUNTER — Other Ambulatory Visit: Payer: Self-pay

## 2019-08-04 ENCOUNTER — Telehealth: Payer: Self-pay | Admitting: Gastroenterology

## 2019-08-04 DIAGNOSIS — R1013 Epigastric pain: Secondary | ICD-10-CM

## 2019-08-04 DIAGNOSIS — K219 Gastro-esophageal reflux disease without esophagitis: Secondary | ICD-10-CM

## 2019-08-04 DIAGNOSIS — Z8619 Personal history of other infectious and parasitic diseases: Secondary | ICD-10-CM

## 2019-08-04 DIAGNOSIS — K581 Irritable bowel syndrome with constipation: Secondary | ICD-10-CM

## 2019-08-04 MED ORDER — NYSTATIN 100000 UNIT/ML MT SUSP: 5.0000 mL | Freq: Two times a day (BID) | OROMUCOSAL | 1 refills | Status: DC

## 2019-08-04 MED ORDER — AMBULATORY NON FORMULARY MEDICATION: 1 refills | Status: DC

## 2019-08-04 NOTE — Telephone Encounter (Signed)
Lets do nystatin same dose, same quantity x 14 days, 1 refill  RG

## 2019-08-04 NOTE — Progress Notes (Signed)
Nystatin RX sent to [facility] per MD recommendations

## 2019-08-04 NOTE — Telephone Encounter (Signed)
Pt called to inform that she got up this morning with a sore throat, when she looked at her throat she saw white spots. She would like some advise.

## 2019-08-04 NOTE — Telephone Encounter (Signed)
Verbal order given for patient to also have GI cocktail refilled per patient request and MD approval; order placed in Epic and patient is aware of RX being sent to pharmacy-

## 2019-08-04 NOTE — Telephone Encounter (Signed)
Called and spoke with patient-patient reports she has thrush in her mouth/stomach-patient is requesting to be prescribed more Nystatin-thinks she did not get a long enough RX-feels like her throat is raw-throat is closing up- has to force self to swallow-throat just hurts-"I am miserable"-"feeling like I was when I went to the ER"-white/yellow patches in the back of throat and on the sides of tongue- Please advise

## 2019-08-08 ENCOUNTER — Encounter: Payer: Self-pay | Admitting: Gastroenterology

## 2019-08-20 ENCOUNTER — Encounter: Payer: Self-pay | Admitting: *Deleted

## 2019-08-20 ENCOUNTER — Telehealth: Payer: Self-pay | Admitting: Neurology

## 2019-08-20 NOTE — Progress Notes (Addendum)
Chivonne Mcgurn (Key: [id number]) Rx #: [id number] Aimovig 70MG /ML auto-injectors   Form [employer] Electronic PA Form (NCPDP) Created 1 day ago Sent to Plan 2 minutes ago Plan Response 1 minute ago Submit Clinical Questions less than a minute ago Determination Wait for Determination Please wait for [employer]_NCPDP to return a determination.  Determination. Full letter sent to scan into her chart. I called her to let her know outcome.   Plan Member Name: [facility] [place] Plan Member ID:  Prescriber Name: ADAM JAFFE Prescriber Phone: [phone] Prescriber Fax: [phone] Dear Lynelle Smoke [place]: [facility] [employer]  received a request from your provider for coverage of Aimovig 70MG /ML [place] SOAJ. As long as you remain covered by the [facility] and there are no changes to your plan benefits, this request is approved for the following time period: 08/20/2019 - 08/19/2020

## 2019-08-20 NOTE — Telephone Encounter (Signed)
Patient is calling in about the aimovig medication she said every month that she gets a refill they are requiring a PA be done. She is trying to get more info on why this is. She thought it was good for a couple of months but she is experiencing a migraine coming on and is needing the medication. Thanks!

## 2019-08-23 NOTE — Telephone Encounter (Signed)
Called spoke with patient she was made aware that Rx is approved and no PA is needed until next year 08/19/2020

## 2019-08-23 NOTE — Telephone Encounter (Signed)
Plan Member Name: [facility] [place] Plan Member ID:  Prescriber Name: ADAM JAFFE Prescriber Phone: [phone] Prescriber Fax: [phone] Dear Lynelle Smoke [place]: [facility] [employer]  received a request from your provider for coverage of Aimovig 70MG /ML [place] SOAJ. As long as you remain covered by the [facility] and there are no changes to your plan benefits, this request is approved for the following time period: 08/20/2019 - 08/19/2020

## 2019-10-01 ENCOUNTER — Other Ambulatory Visit: Payer: Self-pay | Admitting: Neurology

## 2019-10-01 NOTE — Telephone Encounter (Signed)
Requested Prescriptions   Pending Prescriptions Disp Refills  . naproxen sodium (ANAPROX) 550 MG tablet [Pharmacy Med Name: NAPROXEN SODIUM 550 MG TAB] 20 tablet 1    Sig: TAKE 1 TABLET BY MOUTH EVERY 12 HOURS AS NEEDED   Rx last filled:03/08/19 #20 1 refills  Pt last seen: 04/21/19   Follow up appt scheduled:10/22/19

## 2019-10-20 NOTE — Progress Notes (Signed)
Virtual Visit via Video Note The purpose of this virtual visit is to provide medical care while limiting exposure to the novel coronavirus.    Consent was obtained for video visit:  Yes.   Answered questions that patient had about telehealth interaction:  Yes.   I discussed the limitations, risks, security and privacy concerns of performing an evaluation and management service by telemedicine. I also discussed with the patient that there may be a patient responsible charge related to this service. The patient expressed understanding and agreed to proceed.  Pt location: Home Physician Location: office Name of referring provider:  Gordan PaymentGrisso, Greg A., MD I connected with Alison Curry at patients initiation/request on 10/22/2019 at  3:30 PM EST by video enabled telemedicine application and verified that I am speaking with the correct person using two identifiers. Pt MRN:  [id number] Pt DOB:  11-19-1975 Video Participants:  Alison Curry   History of Present Illness:  Alison Curry is a 43 year old right-handed woman who follows up for migraines.  UPDATE: Intensity:  7/10 Duration:  Within 30 minutes with Tosymra Frequency:  4 to 5 a month. She does wake up every morning with a headache that eases with naproxen.  She also has some neck pain which she treats with Flexeril at night, which has helped with the headaches. Rescue protocol: Naproxen or Tosymra. Current NSAIDS:Naproxen 500 mg Current analgesics:Rarely hydrocodone Current triptans:Tosymra; Maxalt (has not recently used) Current ergotamine:none Current anti-emetic:None Current muscle relaxants:Flexeril Current anti-anxiolytic:Xanax Current sleep aide:none Current Antihypertensive medications:none Current Antidepressant medications:Lexapro Current Anticonvulsant medications:none Current anti-CGRP:Aimovig 70mg  Current Vitamins/Herbal/Supplements:none Current Antihistamines/Decongestants:none Other  therapy:None Birth control/hormone: None Other medication:Adderall  Depression and anxiety:  Controlled  HISTORY: Onset: 2013.She was initially diagnosed with trigeminal neuralgia, as it occurred on the right side following a root canal.Later, she was diagnosed with combination of trigeminal neuralgia and migraine.Workup included MRI of brain and cervical spine. Location: Started at base of the skull, radiating to the ears, jaw and head.Initially it was right sided, then became bilateral.She also reports neck pain. Quality:Constant pain.It was not paroxysmal or electric Initial intensity:7-8/10, sometimes up to 10/10 Aura:sometimes sees orbs Prodrome:no Postdrome:  Head is sore/scalp tingles Associated symptoms: Nausea when severe.  Initial Duration:Over a day Initial Frequency:25 headache days per month (8-10 severe migraine where she is incapacitated and nauseous) Triggers: Unknown  Past abortive medication:Advil, Aleve, Excedrin, Tylenol, Vicodin, Maxalt Past preventative medication:Nortriptyline, amitriptyline, propranolol, atenolol, Cymbalta, Zoloft (all with side effects), topiramate 100mg , Botox (effective but no longer able to afford it)  She had an MRI of the brain without and with contrast performed 11/15/12, which was normal.  Past Medical History: Past Medical History:  Diagnosis Date  . Anxiety and depression   . GERD (gastroesophageal reflux disease)   . Hypothyroidism   . Menometrorrhagia   . Migraines   . PID (pelvic inflammatory disease)     Medications: Outpatient Encounter Medications as of 10/22/2019  Medication Sig Note  . acyclovir (ZOVIRAX) 400 MG tablet Take 400 mg by mouth 2 (two) times daily as needed. 07/21/2015: Received from: External Pharmacy Received Sig: Take 400 mg by mouth 2 (two) times daily.  [name]. ALPRAZolam (XANAX) 0.25 MG tablet Take 0.25 mg by mouth every 12 (twelve) hours. 08/23/2015: Received from:  External Pharmacy  . AMBULATORY NON FORMULARY MEDICATION Medication Name: GI Cocktail ( Equal parts of Viscous Lidocaine, Bentyl and Maalox) Take 15 cc by mouth every 6 hours x 14 days   . amphetamine-dextroamphetamine (ADDERALL  XR) 25 MG 24 hr capsule Take 25 mg by mouth every morning.   [name] amphetamine-dextroamphetamine (ADDERALL XR) 5 MG 24 hr capsule Take 5 mg by mouth daily.   . cyanocobalamin (,VITAMIN B-12,) 1000 MCG/ML injection Inject 1 mL into the muscle every 14 (fourteen) days. 07/21/2015: Received from: External Pharmacy Received Sig: INJECT 1 ML INTRAMUSCULARLY TWICE A MONTH  . cyclobenzaprine (FLEXERIL) 10 MG tablet Take 1 tablet (10 mg total) by mouth 3 (three) times daily as needed for muscle spasms.   [name] dexlansoprazole (DEXILANT) 60 MG capsule Take 1 capsule (60 mg total) by mouth daily.   Dorise Hiss (AIMOVIG) 70 MG/ML SOAJ Inject 70 mg into the skin every 30 (thirty) days.   [name] escitalopram (LEXAPRO) 10 MG tablet Take 1 tablet by mouth daily. 07/21/2015: Received from: [facility] Received Sig: Take by mouth.  . ferrous sulfate 325 (65 FE) MG tablet Take 325 mg by mouth daily.   [name] HYDROcodone-acetaminophen (NORCO/VICODIN) 5-325 MG tablet Take by mouth.   . levothyroxine (SYNTHROID, LEVOTHROID) 112 MCG tablet Take 1 tablet by mouth daily. 07/21/2015: Received from: [facility] Received Sig: Take by mouth.  . linaclotide (LINZESS) 72 MCG capsule Take 1 capsule (72 mcg total) by mouth daily. (Patient not taking: Reported on 07/30/2019)   . naproxen sodium (ANAPROX) 550 MG tablet TAKE 1 TABLET BY MOUTH EVERY 12 HOURS AS NEEDED   . nystatin (MYCOSTATIN) 100000 UNIT/ML suspension Take 5 mLs (500,000 Units total) by mouth 2 (two) times daily.   . pantoprazole (PROTONIX) 40 MG tablet Take 1 tablet (40 mg total) by mouth 2 (two) times daily.   . rizatriptan (MAXALT) 10 MG tablet TAKE 1 TABLET (10 MG TOTAL) BY MOUTH ONCE AS NEEDED. MAY TAKE A SECOND DOSE AFTER  2 HOURS IF NEEDED   . SUMAtriptan (TOSYMRA) 10 MG/ACT SOLN Place 1 spray into the nose every hour as needed (maximum 3 sprays in 24 hours).   . Vitamin D, Ergocalciferol, (DRISDOL) 50000 units CAPS capsule Take 1 capsule by mouth once a week.    No facility-administered encounter medications on file as of 10/22/2019.     Allergies: Allergies  Allergen Reactions  . Bupropion Other (See Comments)    Nervousness  . Cephalexin Other (See Comments)    nervousness    Family History: Family History  Problem Relation Age of Onset  . Osteoporosis Mother   . Atrial fibrillation Mother   . Heart disease Mother   . Colon cancer Neg Hx   . Esophageal cancer Neg Hx   . Stomach cancer Neg Hx     Social History: Social History   Socioeconomic History  . Marital status: Married    Spouse name: Not on file  . Number of children: Not on file  . Years of education: Not on file  . Highest education level: Not on file  Occupational History  . Not on file  Social Needs  . Financial resource strain: Not on file  . Food insecurity    Worry: Not on file    Inability: Not on file  . Transportation needs    Medical: Not on file    Non-medical: Not on file  Tobacco Use  . Smoking status: Former Smoker    Types: Cigarettes    Quit date: 07/28/2009    Years since quitting: 10.2  . Smokeless tobacco: Never Used  . Tobacco comment: socially  Substance and Sexual Activity  . Alcohol use: Yes  Alcohol/week: 0.0 standard drinks    Comment: socially  . Drug use: No  . Sexual activity: Not on file  Lifestyle  . Physical activity    Days per week: Not on file    Minutes per session: Not on file  . Stress: Not on file  Relationships  . Social Herbalist on phone: Not on file    Gets together: Not on file    Attends religious service: Not on file    Active member of club or organization: Not on file    Attends meetings of clubs or organizations: Not on file    Relationship  status: Not on file  . Intimate partner violence    Fear of current or ex partner: Not on file    Emotionally abused: Not on file    Physically abused: Not on file    Forced sexual activity: Not on file  Other Topics Concern  . Not on file  Social History Narrative   Right handed   Lives in single story home with 2 sons    Observations/Objective:   Height 5\' 1"  (1.549 m), weight 128 lb (58.1 kg). No acute distress.  Alert and oriented.  Speech fluent and not dysarthric.  Language intact.  Eyes orthophoric on primary gaze.  Face symmetric.  Assessment and Plan:   1.  Migraine with aura, without status migrainosus, not intractable.  Increased frequency.  2.  Cervicalgia.  Reports daily morning headaches which may be related to neck stiffness.  1.  For preventative management, increase Aimovig to 140mg  monthly 2.  For abortive therapy, Tosymra or naproxen 3. For neck pain, refer to Dr. Hulan Saas. 4.  Limit use of pain relievers to no more than 2 days out of week to prevent risk of rebound or medication-overuse headache. 5.  Keep headache diary 6.  Exercise, hydration, caffeine cessation, sleep hygiene, monitor for and avoid triggers 7.  Consider:  magnesium citrate 400mg  daily, riboflavin 400mg  daily, and coenzyme Q10 100mg  three times daily 8. Follow up 5 months.  Follow Up Instructions:    -I discussed the assessment and treatment plan with the patient. The patient was provided an opportunity to ask questions and all were answered. The patient agreed with the plan and demonstrated an understanding of the instructions.   The patient was advised to call back or seek an in-person evaluation if the symptoms worsen or if the condition fails to improve as anticipated.    Dudley Major, DO

## 2019-10-22 ENCOUNTER — Telehealth (INDEPENDENT_AMBULATORY_CARE_PROVIDER_SITE_OTHER): Payer: BC Managed Care – PPO | Admitting: Neurology

## 2019-10-22 ENCOUNTER — Encounter: Payer: Self-pay | Admitting: Neurology

## 2019-10-22 ENCOUNTER — Other Ambulatory Visit: Payer: Self-pay

## 2019-10-22 VITALS — Ht 61.0 in | Wt 128.0 lb

## 2019-10-22 DIAGNOSIS — M542 Cervicalgia: Secondary | ICD-10-CM

## 2019-10-22 DIAGNOSIS — G43009 Migraine without aura, not intractable, without status migrainosus: Secondary | ICD-10-CM

## 2019-10-22 MED ORDER — AIMOVIG 140 MG/ML ~~LOC~~ SOAJ: 140.0000 mg | SUBCUTANEOUS | 11 refills | Status: DC

## 2019-10-22 NOTE — Addendum Note (Signed)
Addended by: Amado Coe on: 10/22/2019 03:44 PM   Modules accepted: Orders

## 2019-11-08 ENCOUNTER — Encounter: Payer: Self-pay | Admitting: Emergency Medicine

## 2019-11-16 ENCOUNTER — Other Ambulatory Visit: Payer: Self-pay

## 2019-11-16 ENCOUNTER — Telehealth: Payer: Self-pay | Admitting: Neurology

## 2019-11-16 ENCOUNTER — Encounter: Payer: Self-pay | Admitting: Family Medicine

## 2019-11-16 ENCOUNTER — Ambulatory Visit (INDEPENDENT_AMBULATORY_CARE_PROVIDER_SITE_OTHER): Payer: BC Managed Care – PPO | Admitting: Family Medicine

## 2019-11-16 VITALS — BP 112/84 | HR 92 | Ht 61.0 in | Wt 135.4 lb

## 2019-11-16 DIAGNOSIS — M62838 Other muscle spasm: Secondary | ICD-10-CM | POA: Diagnosis not present

## 2019-11-16 DIAGNOSIS — M5412 Radiculopathy, cervical region: Secondary | ICD-10-CM

## 2019-11-16 MED ORDER — PREGABALIN 75 MG PO CAPS: 75.0000 mg | ORAL_CAPSULE | Freq: Two times a day (BID) | ORAL | 0 refills | Status: DC

## 2019-11-16 MED ORDER — PREDNISONE 50 MG PO TABS: 50.0000 mg | ORAL_TABLET | Freq: Every day | ORAL | 0 refills | Status: DC

## 2019-11-16 MED ORDER — HYDROCODONE-ACETAMINOPHEN 5-325 MG PO TABS: 1.0000 | ORAL_TABLET | Freq: Four times a day (QID) | ORAL | 0 refills | Status: DC | PRN

## 2019-11-16 NOTE — Telephone Encounter (Signed)
Patient left msg wanting to know the name of the neck specialist that Dr. Everlena Cooper had referred her to. She is needing to call their office. Thanks!

## 2019-11-16 NOTE — Telephone Encounter (Signed)
Pt called

## 2019-11-16 NOTE — Patient Instructions (Signed)
Thank you for coming in today. Attend PT.  Use the medicine for pain and for inflammation.  Recheck with me soon if not improving.  Come back or go to the emergency room if you notice new weakness new numbness problems walking or bowel or bladder problems. Return in about 1 month if doing ok.    Cervical Radiculopathy  Cervical radiculopathy happens when a nerve in the neck (a cervical nerve) is pinched or bruised. This condition can happen because of an injury to the cervical spine (vertebrae) in the neck, or as part of the normal aging process. Pressure on the cervical nerves can cause pain or numbness that travels from the neck all the way down into the arm and fingers. Usually, this condition gets better with rest. Treatment may be needed if the condition does not improve. What are the causes? This condition may be caused by:  A neck injury.  A bulging (herniated) disk.  Muscle spasms.  Muscle tightness in the neck because of overuse.  Arthritis.  Breakdown or degeneration in the bones and joints of the spine (spondylosis) due to aging.  Bone spurs that may develop near the cervical nerves. What are the signs or symptoms? Symptoms of this condition include:  Pain. The pain may travel from the neck to the arm and hand. The pain can be severe or irritating. It may be worse when you move your neck.  Numbness or tingling in your arm or hand.  Weakness in the affected arm and hand, in severe cases. How is this diagnosed? This condition may be diagnosed based on your symptoms, your medical history, and a physical exam. You may also have tests, including:  X-rays.  A CT scan.  An MRI.  An electromyogram (EMG).  Nerve conduction tests. How is this treated? In many cases, treatment is not needed for this condition. With rest, the condition usually gets better over time. If treatment is needed, options may include:  Wearing a soft neck collar (cervical collar) for short  periods of time, as told by your health care provider.  Doing physical therapy to strengthen your neck muscles.  Taking medicines, such as NSAIDs or oral corticosteroids.  Having spinal injections, in severe cases.  Having surgery. This may be needed if other treatments do not help. Different types of surgery may be done depending on the cause of this condition. Follow these instructions at home: If you have a cervical collar:  Wear it as told by your health care provider. Remove it only as told by your health care provider.  Ask your health care provider if you can remove the collar for cleaning and bathing. If you are allowed to remove the collar for cleaning or bathing: ? Follow instructions from your health care provider about how to remove the collar safely. ? Clean the collar by wiping it with mild soap and water and drying it completely. ? Take out any removable pads in the collar every 1-2 days, and wash them by hand with soap and water. Let them air-dry completely before you put them back in the collar. ? Check your skin under the collar for irritation or sores. If you see any, tell your health care provider. Managing pain      Take over-the-counter and prescription medicines only as told by your health care provider.  If directed, put ice on the affected area. ? If you have a soft neck collar, remove it as told by your health care provider. ? Put  ice in a plastic bag. ? Place a towel between your skin and the bag. ? Leave the ice on for 20 minutes, 2-3 times a day.  If applying ice does not help, you can try using heat. Use the heat source that your health care provider recommends, such as a moist heat pack or a heating pad. ? Place a towel between your skin and the heat source. ? Leave the heat on for 20-30 minutes. ? Remove the heat if your skin turns bright red. This is especially important if you are unable to feel pain, heat, or cold. You may have a greater risk of  getting burned.  Try a gentle neck and shoulder massage to help relieve symptoms. Activity  Rest as needed.  Return to your normal activities as told by your health care provider. Ask your health care provider what activities are safe for you.  Do stretching and strengthening exercises as told by your health care provider or physical therapist.  Do not lift anything that is heavier than 10 lb (4.5 kg) until your health care provider tells you that it is safe. General instructions  Use a flat pillow when you sleep.  Do not drive while wearing a cervical collar. If you do not have a cervical collar, ask your health care provider if it is safe to drive while your neck heals.  Ask your health care provider if the medicine prescribed to you requires you to avoid driving or using heavy machinery.  Do not use any products that contain nicotine or tobacco, such as cigarettes, e-cigarettes, and chewing tobacco. These can delay healing. If you need help quitting, ask your health care provider.  Keep all follow-up visits as told by your health care provider. This is important. Contact a health care provider if:  Your condition does not improve with treatment. Get help right away if:  Your pain gets much worse and cannot be controlled with medicines.  You have weakness or numbness in your hand, arm, face, or leg.  You have a high fever.  You have a stiff, rigid neck.  You lose control of your bowels or your bladder (have incontinence).  You have trouble with walking, balance, or speaking. Summary  Cervical radiculopathy happens when a nerve in the neck is pinched or bruised.  A nerve can get pinched from a bulging disk, arthritis, muscle spasms, or an injury to the neck.  Symptoms include pain, tingling, or numbness radiating from the neck into the arm or hand. Weakness can also occur in severe cases.  Treatment may include rest, wearing a cervical collar, and physical therapy.  Medicines may be prescribed to help with pain. In severe cases, injections or surgery may be needed. This information is not intended to replace advice given to you by your health care provider. Make sure you discuss any questions you have with your health care provider. Document Revised: 09/18/2018 Document Reviewed: 09/18/2018 Elsevier Patient Education  2020 [employer].

## 2019-11-16 NOTE — Progress Notes (Signed)
Subjective:    I'm seeing this patient as a consultation for:  Dr. Everlena Curry. Note will be routed back to referring provider/PCP.  CC: Neck pain and R shoulder pain  I, Alison Curry, LAT, ATC, am serving as scribe for Alison Curry.  HPI: Pt is a 44 y/o female presenting w/ c/o neck pain radiating into her bilateral upper arms right worse than left..  She is being followed by Dr. Everlena Curry for migraines and he referred her to Sports Medicine for her neck pain.  Pt takes Flexeril for her neck pain.  Pt states that her neck pain has gotten progressively worse over the last 2-3 months and has transitioned into neck pain w/ bilateral arm pain.  She states that her neck feels very tight on the R side of her neck which is also causing pain along the R side of her head.  Aggravating factors include sudden cervical motions, cervical flexion and cervical rotation.  Pt reports radiating pain into B UEs and B numbness/tingling in B UEs and hands.  She has been taking Flexeril and resting for her neck and B UEs.  She has visited a chiropractor in the past but hasn't been in  76months - 1 year.  Past medical history, Surgical history, Family history not pertinant except as noted below, Social history, Allergies, and medications have been entered into the medical record, reviewed, and no changes needed.   Review of Systems: No headache, visual changes, nausea, vomiting, diarrhea, constipation, dizziness, abdominal pain, skin rash, fevers, chills, night sweats, weight loss, swollen lymph nodes, body aches, joint swelling, muscle aches, chest pain, shortness of breath, mood changes, visual or auditory hallucinations.   Objective:    Vitals:   11/16/19 1538  BP: 112/84  Pulse: 92  SpO2: 98%   General: Well Developed, well nourished, and in no acute distress.  Neuro/Psych: Alert and oriented x3, extra-ocular muscles intact, able to move all 4 extremities, sensation grossly intact. Skin: Warm and dry, no rashes  noted.  Respiratory: Not using accessory muscles, speaking in full sentences, trachea midline.  Cardiovascular: Pulses palpable, no extremity edema. Abdomen: Does not appear distended. MSK:  C-spine normal appearing. Range of motion limited rotation lateral flexion bilaterally. Upper extremity strength is equal normal throughout bilateral extremities. Reflexes equal normal bilateral upper 70s. Sensation intact bilateral extremities.  Shoulder exam bilateral has normal range of motion normal strength negative impingement testing.  Pulses cap refill intact bilateral upper extremities.    Impression and Recommendations:    Assessment and Plan: 44 y.o. female with  Chronic neck pain with new onset bilateral upper extremity pain.  Pain radiates past the elbow but not to the hand and corresponds likely to C5 or T1 dermatome.  Concerning for cervical radiculopathy.  Discussed treatment plans and options.  Plan for course of prednisone, and referral to physical therapy.  Patient notes that she has had gabapentin in the past and not tolerated well so we will try Lyrica for nerve pain.  Additionally will use small amount of hydrocodone for severe pain.  Check back with me in about a month or sooner if needed.  Precautions reviewed.[name]  PDMP reviewed during this encounter. Orders Placed This Encounter  Procedures  . Ambulatory referral to Physical Therapy    Referral Priority:   Routine    Referral Type:   Physical Medicine    Referral Reason:   Specialty Services Required    Requested Specialty:   Physical Therapy   Meds ordered  this encounter  Medications  . predniSONE (DELTASONE) 50 MG tablet    Sig: Take 1 tablet (50 mg total) by mouth daily.    Dispense:  5 tablet    Refill:  0  . pregabalin (LYRICA) 75 MG capsule    Sig: Take 1 capsule (75 mg total) by mouth 2 (two) times daily.    Dispense:  60 capsule    Refill:  0  . HYDROcodone-acetaminophen (NORCO/VICODIN) 5-325 MG tablet     Sig: Take 1 tablet by mouth every 6 (six) hours as needed.    Dispense:  15 tablet    Refill:  0    Discussed warning signs or symptoms. Please see discharge instructions. Patient expresses understanding.   The above documentation has been reviewed and is accurate and complete Alison Curry

## 2019-11-17 ENCOUNTER — Encounter: Payer: Self-pay | Admitting: Family Medicine

## 2019-11-18 ENCOUNTER — Ambulatory Visit: Payer: BC Managed Care – PPO | Admitting: Family Medicine

## 2019-11-24 ENCOUNTER — Other Ambulatory Visit: Payer: Self-pay

## 2019-11-24 DIAGNOSIS — R202 Paresthesia of skin: Secondary | ICD-10-CM

## 2019-11-24 DIAGNOSIS — G5603 Carpal tunnel syndrome, bilateral upper limbs: Secondary | ICD-10-CM

## 2019-11-25 ENCOUNTER — Telehealth: Payer: Self-pay | Admitting: Gastroenterology

## 2019-11-25 DIAGNOSIS — R140 Abdominal distension (gaseous): Secondary | ICD-10-CM

## 2019-11-25 DIAGNOSIS — R1011 Right upper quadrant pain: Secondary | ICD-10-CM

## 2019-11-25 NOTE — Telephone Encounter (Signed)
Patient is calling with concerns she is having issues with heartburn and a lot of pain on the right side of her back

## 2019-11-25 NOTE — Telephone Encounter (Signed)
Called and spoke with patient-patient reports her stomach is really bloated, heartburn for the last 2 weeks (off/on)-last 2 days been really bad; patient reports she developed right side abd pain radiating to her back-able to pass gas, burping, and has not had relief from different movements; back ache really does not feel like a "gas" pain; reports having a bowel movement yesterday-takes Linzess but regularly has a BM every other day; stomach feels "swollen and hot- like hot inside of me"; has been having a "gross taste in my mouth, my throat is really dry, having really bad abdominal pain"; "do not feel like this is related to being constipated";  Patient reports she is consuming plenty of water and is a vegetarian so she is getting plenty of fruits and veggies;   Please advise

## 2019-11-26 NOTE — Telephone Encounter (Signed)
Right upper quadrant abdominal pain with bloating, radiating to the back. Neg EGD  Plan: -Check CBC, CMP, lipase. -[place] Abdo complete -FU thereafter.  RG

## 2019-11-26 NOTE — Telephone Encounter (Signed)
Called and spoke with patient-patient informed of MD recommendations; patient is agreeable with plan of care and has been scheduled for her [place] abd complete on 11/29/2019 at 11:00 am; NPO after 5:00 am; patient is aware of needing to complete lab work at her convenience; Patient verbalized understanding of information/instructions;  Patient was advised to call the office at [phone] if questions/concerns arise;

## 2019-11-26 NOTE — Addendum Note (Signed)
Addended by: Johnney Killian on: 11/26/2019 03:22 PM   Modules accepted: Orders

## 2019-11-27 ENCOUNTER — Other Ambulatory Visit: Payer: Self-pay | Admitting: Gastroenterology

## 2019-11-29 ENCOUNTER — Other Ambulatory Visit (INDEPENDENT_AMBULATORY_CARE_PROVIDER_SITE_OTHER): Payer: BC Managed Care – PPO

## 2019-11-29 ENCOUNTER — Other Ambulatory Visit: Payer: Self-pay

## 2019-11-29 ENCOUNTER — Ambulatory Visit (HOSPITAL_BASED_OUTPATIENT_CLINIC_OR_DEPARTMENT_OTHER)
Admission: RE | Admit: 2019-11-29 | Discharge: 2019-11-29 | Disposition: A | Discharge status: Home / Self Care | Payer: BC Managed Care – PPO | Source: Ambulatory Visit | Attending: Gastroenterology | Admitting: Gastroenterology

## 2019-11-29 DIAGNOSIS — R140 Abdominal distension (gaseous): Secondary | ICD-10-CM

## 2019-11-29 DIAGNOSIS — R1011 Right upper quadrant pain: Secondary | ICD-10-CM

## 2019-11-29 LAB — CBC WITH DIFFERENTIAL/PLATELET
Basophils Absolute: 0 10*3/uL (ref 0.0–0.1)
Basophils Relative: 0.4 % (ref 0.0–3.0)
Eosinophils Absolute: 0 10*3/uL (ref 0.0–0.7)
Eosinophils Relative: 0.6 % (ref 0.0–5.0)
HCT: 43.3 % (ref 36.0–46.0)
Hemoglobin: 14.5 g/dL (ref 12.0–15.0)
Lymphocytes Relative: 26.4 % (ref 12.0–46.0)
Lymphs Abs: 1.4 10*3/uL (ref 0.7–4.0)
MCHC: 33.4 g/dL (ref 30.0–36.0)
MCV: 94.9 fl (ref 78.0–100.0)
Monocytes Absolute: 0.4 10*3/uL (ref 0.1–1.0)
Monocytes Relative: 7.9 % (ref 3.0–12.0)
Neutro Abs: 3.5 10*3/uL (ref 1.4–7.7)
Neutrophils Relative %: 64.7 % (ref 43.0–77.0)
Platelets: 182 10*3/uL (ref 150.0–400.0)
RBC: 4.57 Mil/uL (ref 3.87–5.11)
RDW: 12 % (ref 11.5–15.5)
WBC: 5.4 10*3/uL (ref 4.0–10.5)

## 2019-11-29 LAB — COMPREHENSIVE METABOLIC PANEL
ALT: 17 U/L (ref 0–35)
AST: 18 U/L (ref 0–37)
Albumin: 4.2 g/dL (ref 3.5–5.2)
Alkaline Phosphatase: 64 U/L (ref 39–117)
BUN: 13 mg/dL (ref 6–23)
CO2: 28 mEq/L (ref 19–32)
Calcium: 9.5 mg/dL (ref 8.4–10.5)
Chloride: 104 mEq/L (ref 96–112)
Creatinine, Ser: 0.73 mg/dL (ref 0.40–1.20)
GFR: 86.18 mL/min (ref >60.00)
Glucose, Bld: 93 mg/dL (ref 70–99)
Potassium: 4.2 mEq/L (ref 3.5–5.1)
Sodium: 140 mEq/L (ref 135–145)
Total Bilirubin: 0.5 mg/dL (ref 0.2–1.2)
Total Protein: 6.4 g/dL (ref 6.0–8.3)

## 2019-11-29 LAB — LIPASE: Lipase: 23 U/L (ref 11.0–59.0)

## 2019-12-01 ENCOUNTER — Ambulatory Visit (INDEPENDENT_AMBULATORY_CARE_PROVIDER_SITE_OTHER): Payer: BC Managed Care – PPO | Admitting: Neurology

## 2019-12-01 ENCOUNTER — Other Ambulatory Visit: Payer: Self-pay

## 2019-12-01 ENCOUNTER — Ambulatory Visit: Payer: BC Managed Care – PPO | Admitting: Physical Therapy

## 2019-12-01 DIAGNOSIS — R202 Paresthesia of skin: Secondary | ICD-10-CM

## 2019-12-01 DIAGNOSIS — G5602 Carpal tunnel syndrome, left upper limb: Secondary | ICD-10-CM

## 2019-12-01 NOTE — Procedures (Signed)
[facility]  [place], [address]  [place], [place] [address] Tel: [phone] Fax:  [phone] Test Date:  12/01/2019  Patient: Alison Curry DOB: 1975-12-26 Physician: Narda Amber, DO  Sex: Female Height: 5\' 1"  Ref Phys: Daryll Brod, MD  ID#: [id number] Temp: 33.0C Technician:    Patient Complaints: This is a 44 year old female with history of right carpal tunnel release referred for evaluation of left hand paresthesias.  NCV & EMG Findings: Extensive electrodiagnostic testing of the left upper extremity and additional studies of the right shows:  1. Left median sensory response shows prolonged latency (3.8 ms) and reduced amplitude (19.7 V).  Right median, right mixed palmar, and bilateral ulnar sensory responses are within normal limits. 2. Left median motor response shows prolonged latency (4.0 ms).  Right median and bilateral ulnar motor responses are within normal limits. 3. There is no evidence of active or chronic motor axonal loss changes affecting any of the tested muscles.  Motor unit configuration and recruitment pattern is within normal limits.  Impression: 1. 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   2. There is no recurrence of right carpal tunnel syndrome.   ___________________________ Narda Amber, DO    Nerve Conduction Studies Anti Sensory Summary Table   Site NR Peak (ms) Norm Peak (ms) P-T Amp (V) Norm P-T Amp  Left Median Anti Sensory (2nd Digit)  33C  Wrist    3.8 <3.4 19.7 >20  Right Median Anti Sensory (2nd Digit)  33C  Wrist    2.8 <3.4 45.8 >20  Left Ulnar Anti Sensory (5th Digit)  33C  Wrist    2.3 <3.1 52.1 >12  Right Ulnar Anti Sensory (5th Digit)  33C  Wrist    2.3 <3.1 50.4 >12   Motor Summary Table   Site NR Onset (ms) Norm Onset (ms) O-P Amp (mV) Norm O-P Amp Site1 Site2 Delta-0 (ms) Dist (cm) Vel (m/s) Norm Vel (m/s)  Left  Median Motor (Abd Poll Brev)  33C  Wrist    4.0 <3.9 9.6 >6 Elbow Wrist 4.4 26.0 59 >50  Elbow    8.4  9.6         Right Median Motor (Abd Poll Brev)  33C  Wrist    3.0 <3.9 8.5 >6 Elbow Wrist 4.7 26.0 55 >50  Elbow    7.7  8.5         Left Ulnar Motor (Abd Dig Minimi)  33C  Wrist    2.0 <3.1 11.0 >7 B Elbow Wrist 3.0 20.0 67 >50  B Elbow    5.0  10.9  A Elbow B Elbow 1.6 10.0 63 >50  A Elbow    6.6  10.7         Right Ulnar Motor (Abd Dig Minimi)  33C  Wrist    1.7 <3.1 12.7 >7 B Elbow Wrist 3.1 20.0 65 >50  B Elbow    4.8  12.3  A Elbow B Elbow 1.7 10.0 59 >50  A Elbow    6.5  12.3          Comparison Summary Table   Site NR Peak (ms) Norm Peak (ms) P-T Amp (V) Site1 Site2 Delta-P (ms) Norm Delta (ms)  Right Median/Ulnar Palm Comparison (Wrist - 8cm)  33C  Median Palm    1.8 <2.2 34.6 Median Palm Ulnar Palm 0.3   Ulnar Palm    1.5 <2.2  29.9       EMG   Side Muscle Ins Act Fibs Psw Fasc Number Recrt Dur Dur. Amp Amp. Poly Poly. Comment  Left 1stDorInt Nml Nml Nml Nml Nml Nml Nml Nml Nml Nml Nml Nml N/A  Left Abd Poll Brev Nml Nml Nml Nml Nml Nml Nml Nml Nml Nml Nml Nml N/A  Left PronatorTeres Nml Nml Nml Nml Nml Nml Nml Nml Nml Nml Nml Nml N/A  Left Biceps Nml Nml Nml Nml Nml Nml Nml Nml Nml Nml Nml Nml N/A  Left Triceps Nml Nml Nml Nml Nml Nml Nml Nml Nml Nml Nml Nml N/A  Left Deltoid Nml Nml Nml Nml Nml Nml Nml Nml Nml Nml Nml Nml N/A      Waveforms:

## 2019-12-06 ENCOUNTER — Ambulatory Visit: Payer: BC Managed Care – PPO | Admitting: Nurse Practitioner

## 2019-12-07 ENCOUNTER — Other Ambulatory Visit: Payer: Self-pay | Admitting: Orthopedic Surgery

## 2019-12-09 ENCOUNTER — Other Ambulatory Visit: Payer: Self-pay

## 2019-12-09 ENCOUNTER — Encounter: Payer: Self-pay | Admitting: Nurse Practitioner

## 2019-12-09 ENCOUNTER — Telehealth: Payer: Self-pay | Admitting: Nurse Practitioner

## 2019-12-09 ENCOUNTER — Ambulatory Visit: Payer: BC Managed Care – PPO | Admitting: Nurse Practitioner

## 2019-12-09 DIAGNOSIS — K5900 Constipation, unspecified: Secondary | ICD-10-CM | POA: Diagnosis not present

## 2019-12-09 DIAGNOSIS — R1011 Right upper quadrant pain: Secondary | ICD-10-CM | POA: Diagnosis not present

## 2019-12-09 NOTE — Telephone Encounter (Signed)
Pt is scheduled for an appt today and requested to cancel appointment.  She stated that she has started medications--is this OK to cancel considering symptoms?

## 2019-12-09 NOTE — Patient Instructions (Signed)
If you are age 46 or older, your body mass index should be between 23-30. Your Body mass index is 26.64 kg/m. If this is out of the aforementioned range listed, please consider follow up with your Primary Care Provider.  If you are age 40 or younger, your body mass index should be between 19-25. Your Body mass index is 26.64 kg/m. If this is out of the aformentioned range listed, please consider follow up with your Primary Care Provider.   Please purchase the following medications over the counter and take as directed: Philips bacterua probiotic once daily.   Complete Nystatin as prescribed by Dr. Chales Abrahams.   Continue Linzess.  Follow up in 2-3 months  Thank you,  Alcide Evener

## 2019-12-09 NOTE — Progress Notes (Signed)
12/09/2019 KESHONDA MONSOUR [id number] 10/01/1976   Chief Complaint: yeast infection, constipation   History of Present Illness:  Dietra M. Averitt is a 44 year old female with a past medical history of anxiety, migraine headaches, hypothyroidism and GERD. She complains of having an extreme dry mouth, a white coating on her tongue, sore throat, heartburn, stomach burning and RUQ abdominal pain x 2 weeks. She contacted Dr .Lyndel Safe 11/25/2019 with complaints of RUQ abdominal pain which radiated to her back and bloating. An abdominal sonogram and labs were ordered. Labs 11/29/2019: Glu 93. Na 140. K 4.2. BUN 13. Cr. 0.73. Alk phos 64. AST 18. ALT 17. T. Bili 0.5. WBC 5.4. Hg 14.5. HCT 43.3. PLT 182. Abdominal sonogram 11/29/3019 showed a normal gallbladder and liver, a few echogenic foci ? ganulomata. Her symptoms have improved and she almost cancelled today's office appointment. Currently, she is having less heartburn but continues to have stomach burning. Eating briefly reduces her stomach burning then the burning recurs. She takes Naproxen 56m one tab 3 to 4 times monthly. She has occasional mild nausea which is more noticeable when her stomach is empty. She is taking Dexilant 612mdaily and Pepcid 2031maily (started 10/2019). She reports having a history of yeast in her esophagus 15 years ago which apparently resolved after taking Diflucan. She developed a white coating to her tongue 2 weeks ago and she is currently taking Nystatin 5ml57m bid.  She was prescribed Prednisone 50mg68m tab po x 5 days 11/16/2019 by orthopedist Dr. Evan Lynne Leaderto having neck and right shoulder pain. She was on an antibiotic for 5 days in November 2020 after having a tooth extraction. Shortly after she developed oral thrush and her oral surgeon prescribed Nystatin. She takes Doxycycline PRN for skin issues, last took 10/2019. She She continues to have generalized abdominal bloat. She has constipation. She can go 3 to 4 days  without a BM. She is taking Linzess QOD which results in passing a normal brown BM every other day. No rectal bleeding or black stools. She was maintained a vegetarian diet but started eating meat and chicken 10/2019.    EGD 07/30/2019 by Dr. GuptaLyndel Safehe examined esophagus was normal. Biopsies were obtained from the proximal and distal esophagus with cold forceps for histology of suspected eosinophilic esophagitis. Biopsies showed reflux, no evidence of eosinophilic esophagitis or candidiasis.  - The Z-line was regular and was found 36 cm from the incisors. - Localized moderate inflammation characterized by erythema was found in the gastric antrum. Biopsies were taken with a cold forceps for histology which showed reactive  gastropathy. No evidence of H. Pylori.  - The examined duodenum was normal. Biopsies for histology were taken with a cold forceps for evaluation of celiac disease. Biopsies were negative for celiac disease.   Current Outpatient Medications on File Prior to Visit  Medication Sig Dispense Refill  . acyclovir (ZOVIRAX) 400 MG tablet Take 400 mg by mouth 2 (two) times daily as needed.  10  . ALPRAZolam (XANAX) 0.25 MG tablet Take 0.25 mg by mouth every 12 (twelve) hours.  2  . AMBULATORY NON FORMULARY MEDICATION Medication Name: GI Cocktail ( Equal parts of Viscous Lidocaine, Bentyl and Maalox) Take 15 cc by mouth every 6 hours x 14 days 900 mL 1  . amphetamine-dextroamphetamine (ADDERALL XR) 25 MG 24 hr capsule Take 25 mg by mouth every morning.    . ampMarland Kitchenetamine-dextroamphetamine (ADDERALL XR) 5 MG 24 hr capsule  Take 5 mg by mouth daily.    . cyanocobalamin (,VITAMIN B-12,) 1000 MCG/ML injection Inject 1 mL into the muscle every 14 (fourteen) days.  1  . cyclobenzaprine (FLEXERIL) 10 MG tablet Take 1 tablet (10 mg total) by mouth 3 (three) times daily as needed for muscle spasms. 90 tablet 3  . dexlansoprazole (DEXILANT) 60 MG capsule Take 1 capsule (60 mg total) by mouth daily.  30 capsule 6  . Erenumab-aooe (AIMOVIG) 140 MG/ML SOAJ Inject 140 mg into the skin every 30 (thirty) days. 1 pen 11  . escitalopram (LEXAPRO) 10 MG tablet Take 1 tablet by mouth daily.    . HYDROcodone-acetaminophen (NORCO/VICODIN) 5-325 MG tablet Take 1 tablet by mouth every 6 (six) hours as needed. 15 tablet 0  . levothyroxine (SYNTHROID, LEVOTHROID) 112 MCG tablet Take 1 tablet by mouth daily. Pt is taking 100 mcg instead    . LINZESS 72 MCG capsule TAKE 1 CAPSULE (72 MCG TOTAL) BY MOUTH DAILY. 30 capsule 0  . naproxen sodium (ANAPROX) 550 MG tablet TAKE 1 TABLET BY MOUTH EVERY 12 HOURS AS NEEDED 20 tablet 1  . nystatin (MYCOSTATIN) 100000 UNIT/ML suspension Take 5 mLs (500,000 Units total) by mouth 2 (two) times daily. 180 mL 1  . rizatriptan (MAXALT) 10 MG tablet TAKE 1 TABLET (10 MG TOTAL) BY MOUTH ONCE AS NEEDED. MAY TAKE A SECOND DOSE AFTER 2 HOURS IF NEEDED 10 tablet 6  . SUMAtriptan (TOSYMRA) 10 MG/ACT SOLN Place 1 spray into the nose every hour as needed (maximum 3 sprays in 24 hours). 9 each 3  . Vitamin D, Ergocalciferol, (DRISDOL) 50000 units CAPS capsule Take 1 capsule by mouth once a week.  0   No current facility-administered medications on file prior to visit.   Allergies  Allergen Reactions  . Bupropion Other (See Comments)    Nervousness  . Cephalexin Other (See Comments)    nervousness    Current Medications, Allergies, Past Medical History, Past Surgical History, Family History and Social History were reviewed in [facility] electronic medical record.   Physical Exam: BP 120/68   Pulse 76   Temp (!) 97.3 F (36.3 C)   Ht 5' 1" (1.549 m)   Wt 141 lb (64 kg)   BMI 26.64 kg/m  General: Well developed 43 year old female in no acute distress Head: Normocephalic and atraumatic Eyes:  Sclerae anicteric, conjunctiva pink  Ears: Normal auditory acuity Mouth: Trace yellowish coating on tongue, upper palate without erythema, posterior pharynx pink without exudate.  No ulcers or lesions.  Lungs: Clear throughout to auscultation Heart: Regular rate and rhythm Abdomen: Soft, non tender and non distended. No masses, no hepatomegaly. Normal bowel sounds x 4 quadrants.  Rectal: Deferred.  Musculoskeletal: Symmetrical with no gross deformities  Extremities: No edema  Neurological: Alert oriented x 4, grossly nonfocal Psychological:  Alert and cooperative. Normal mood and affect  Assessment and Recommendations:  1. 43 year old female with a history of GERD, RUQ abd pain. Abd sono showed a normal gallbladder. Lipase, LFTs normal -continue Dexilant 60mg daily and Pepcid 20mg at bed time -add Carafate 1gm po bid x 10 days -patient to call our office if her symptoms persist or worsen -avoid fatty foods  2. Oral thrush. On Prednisone x 5 days early 11/2019 due to having neck and shoulder pain. WBC 5.4. No hx of DM. Mini -continue Nystatin 5ml bid as previously ordered   3. Colon cancer screening -I recommended scheduling a screening colonoscopy, she   wishes to discuss this further at the time of her next follow up appointment in 2 to 3 months with Dr. Gupta  4. Constipation -Linzess 72mc one tab po QD or QOD to be taken 30 minutes before breakfast -probiotic of choice once daily    

## 2019-12-09 NOTE — Telephone Encounter (Signed)
Called and spoke with patient-patient reports she is feeling better and just did not want to come to the appt=advised patient to keep appt and to update provider on symptoms and medication management;

## 2019-12-10 ENCOUNTER — Other Ambulatory Visit: Payer: Self-pay

## 2019-12-10 DIAGNOSIS — K5900 Constipation, unspecified: Secondary | ICD-10-CM | POA: Insufficient documentation

## 2019-12-10 DIAGNOSIS — R1011 Right upper quadrant pain: Secondary | ICD-10-CM | POA: Insufficient documentation

## 2019-12-10 MED ORDER — SUCRALFATE 1 G PO TABS: 1.0000 g | ORAL_TABLET | Freq: Two times a day (BID) | ORAL | 0 refills | Status: DC

## 2019-12-13 ENCOUNTER — Other Ambulatory Visit: Payer: Self-pay

## 2019-12-13 ENCOUNTER — Encounter (HOSPITAL_BASED_OUTPATIENT_CLINIC_OR_DEPARTMENT_OTHER): Payer: Self-pay | Admitting: Orthopedic Surgery

## 2019-12-14 NOTE — Progress Notes (Signed)
Agree with excellent plan outlined by Colleen RG 

## 2019-12-17 ENCOUNTER — Other Ambulatory Visit (HOSPITAL_COMMUNITY): Payer: BC Managed Care – PPO

## 2019-12-21 ENCOUNTER — Ambulatory Visit (HOSPITAL_BASED_OUTPATIENT_CLINIC_OR_DEPARTMENT_OTHER)
Admission: RE | Admit: 2019-12-21 | Payer: BC Managed Care – PPO | Source: Home / Self Care | Admitting: Orthopedic Surgery

## 2019-12-21 SURGERY — CARPAL TUNNEL RELEASE
Anesthesia: Regional | Laterality: Left

## 2020-01-14 ENCOUNTER — Other Ambulatory Visit: Payer: Self-pay | Admitting: Neurology

## 2020-01-14 DIAGNOSIS — G43009 Migraine without aura, not intractable, without status migrainosus: Secondary | ICD-10-CM

## 2020-01-17 ENCOUNTER — Other Ambulatory Visit: Payer: Self-pay | Admitting: Gastroenterology

## 2020-01-28 ENCOUNTER — Ambulatory Visit (AMBULATORY_SURGERY_CENTER): Payer: Self-pay | Admitting: *Deleted

## 2020-01-28 ENCOUNTER — Other Ambulatory Visit: Payer: Self-pay

## 2020-01-28 VITALS — Temp 97.1°F | Ht 61.0 in | Wt 134.0 lb

## 2020-01-28 DIAGNOSIS — Z01818 Encounter for other preprocedural examination: Secondary | ICD-10-CM

## 2020-01-28 DIAGNOSIS — Z1211 Encounter for screening for malignant neoplasm of colon: Secondary | ICD-10-CM

## 2020-01-28 MED ORDER — CLENPIQ 10-3.5-12 MG-GM -GM/160ML PO SOLN: 1.0000 | ORAL | 0 refills | Status: DC

## 2020-01-28 NOTE — Progress Notes (Signed)
No egg or soy allergy known to patient  No issues with past sedation with any surgeries  or procedures, no intubation problems  No diet pills per patient No home 02 use per patient  No blood thinners per patient  Pt states issues with constipation -onLinzess- she states she is either constipated or has diarrhea  No A fib or A flutter  EMMI video sent to pt's e mail   Due to the COVID-19 pandemic we are asking patients to follow these guidelines. Please only bring one care partner. Please be aware that your care partner may wait in the car in the parking lot or if they feel like they will be too hot to wait in the car, they may wait in the lobby on the 4th floor. All care partners are required to wear a mask the entire time (we do not have any that we can provide them), they need to practice social distancing, and we will do a Covid check for all patient's and care partners when you arrive. Also we will check their temperature and your temperature. If the care partner waits in their car they need to stay in the parking lot the entire time and we will call them on their cell phone when the patient is ready for discharge so they can bring the car to the front of the building. Also all patient's will need to wear a mask into building.

## 2020-02-03 ENCOUNTER — Other Ambulatory Visit: Payer: Self-pay | Admitting: Gastroenterology

## 2020-02-03 ENCOUNTER — Ambulatory Visit (INDEPENDENT_AMBULATORY_CARE_PROVIDER_SITE_OTHER): Payer: BC Managed Care – PPO

## 2020-02-03 DIAGNOSIS — Z1159 Encounter for screening for other viral diseases: Secondary | ICD-10-CM

## 2020-02-04 ENCOUNTER — Encounter: Payer: Self-pay | Admitting: Gastroenterology

## 2020-02-04 LAB — SARS CORONAVIRUS 2 (TAT 6-24 HRS): SARS Coronavirus 2: NEGATIVE

## 2020-02-07 ENCOUNTER — Ambulatory Visit (AMBULATORY_SURGERY_CENTER): Payer: BC Managed Care – PPO | Admitting: Gastroenterology

## 2020-02-07 ENCOUNTER — Other Ambulatory Visit: Payer: Self-pay

## 2020-02-07 ENCOUNTER — Encounter: Payer: Self-pay | Admitting: Gastroenterology

## 2020-02-07 VITALS — BP 104/64 | HR 69 | Temp 97.5°F | Resp 14 | Ht 61.0 in | Wt 134.0 lb

## 2020-02-07 DIAGNOSIS — Z1211 Encounter for screening for malignant neoplasm of colon: Secondary | ICD-10-CM

## 2020-02-07 MED ORDER — SODIUM CHLORIDE 0.9 % IV SOLN: 500.0000 mL | INTRAVENOUS | Status: DC

## 2020-02-07 NOTE — Op Note (Signed)
[facility] Patient Name: Alison Curry Procedure Date: 02/07/2020 9:23 AM MRN: [id number] Endoscopist: Lynann Bologna , MD Age: 44 Referring MD:  Date of Birth: 14-Jul-1976 Gender: Female Account #: [id number] Procedure:                Colonoscopy Indications:              Screening for colorectal malignant neoplasm Medicines:                Monitored Anesthesia Care Procedure:                Pre-Anesthesia Assessment:                           - Prior to the procedure, a History and Physical                            was performed, and patient medications and                            allergies were reviewed. The patient's tolerance of                            previous anesthesia was also reviewed. The risks                            and benefits of the procedure and the sedation                            options and risks were discussed with the patient.                            All questions were answered, and informed consent                            was obtained. Prior Anticoagulants: The patient has                            taken no previous anticoagulant or antiplatelet                            agents. ASA Grade Assessment: II - A patient with                            mild systemic disease. After reviewing the risks                            and benefits, the patient was deemed in                            satisfactory condition to undergo the procedure.                           After obtaining informed consent, the colonoscope  was passed under direct vision. Throughout the                            procedure, the patient's blood pressure, pulse, and                            oxygen saturations were monitored continuously. The                            Colonoscope was introduced through the anus and                            advanced to the 2 cm into the ileum. The                            colonoscopy was performed  without difficulty. The                            patient tolerated the procedure well. The quality                            of the bowel preparation was good. The terminal                            ileum, ileocecal valve, appendiceal orifice, and                            rectum were photographed. Scope In: 9:33:20 AM Scope Out: 9:50:10 AM Scope Withdrawal Time: 0 hours 13 minutes 10 seconds  Total Procedure Duration: 0 hours 16 minutes 50 seconds  Findings:                 A few small-mouthed diverticula were found in the                            sigmoid colon and descending colon. The colon was                            redundant.                           Non-bleeding internal hemorrhoids were found during                            retroflexion. The hemorrhoids were small.                           The terminal ileum appeared normal.                           The exam was otherwise without abnormality on                            direct and retroflexion views. Complications:            No immediate complications.  Estimated Blood Loss:     Estimated blood loss: none. Impression:               - Mild left colonic diverticulosis.                           - Non-bleeding internal hemorrhoids.                           - Otherwise normal colonoscopy to TI. Recommendation:           - Patient has a contact number available for                            emergencies. The signs and symptoms of potential                            delayed complications were discussed with the                            patient. Return to normal activities tomorrow.                            Written discharge instructions were provided to the                            patient.                           - High fiber diet.                           - Continue present medications.                           - Miralax 1 capful (17 grams) in 8 ounces of water                            PO daily.                            - Repeat colonoscopy in 10 years for screening                            purposes. Earlier, if change in family history or                            any new problems.                           - Return to GI office PRN.                           - The findings and recommendations were discussed                            with Margit Banda, MD 02/07/2020 9:55:39 AM  This report has been signed electronically.

## 2020-02-07 NOTE — Progress Notes (Signed)
I have reviewed the patient's medical history in detail and updated the computerized patient record. Temp LC V/s CW 

## 2020-02-07 NOTE — Patient Instructions (Signed)
Handouts given for hemorrhoids, diverticulosis and high fiber diet.  Repeat colonoscopy in 10 years.  Use Miralax 17g, 1 capful in 8 ounces of water daily.  YOU HAD AN ENDOSCOPIC PROCEDURE TODAY AT THE [facility]:   Refer to the procedure report that was given to you for any specific questions about what was found during the examination.  If the procedure report does not answer your questions, please call your gastroenterologist to clarify.  If you requested that your care partner not be given the details of your procedure findings, then the procedure report has been included in a sealed envelope for you to review at your convenience later.  YOU SHOULD EXPECT: Some feelings of bloating in the abdomen. Passage of more gas than usual.  Walking can help get rid of the air that was put into your GI tract during the procedure and reduce the bloating. If you had a lower endoscopy (such as a colonoscopy or flexible sigmoidoscopy) you may notice spotting of blood in your stool or on the toilet paper. If you underwent a bowel prep for your procedure, you may not have a normal bowel movement for a few days.  Please Note:  You might notice some irritation and congestion in your nose or some drainage.  This is from the oxygen used during your procedure.  There is no need for concern and it should clear up in a day or so.  SYMPTOMS TO REPORT IMMEDIATELY:   Following lower endoscopy (colonoscopy or flexible sigmoidoscopy):  Excessive amounts of blood in the stool  Significant tenderness or worsening of abdominal pains  Swelling of the abdomen that is new, acute  Fever of 100F or higher  For urgent or emergent issues, a gastroenterologist can be reached at any hour by calling [phone]. Do not use MyChart messaging for urgent concerns.    DIET:  We do recommend a small meal at first, but then you may proceed to your regular diet.  Drink plenty of fluids but you should avoid alcoholic  beverages for 24 hours.  ACTIVITY:  You should plan to take it easy for the rest of today and you should NOT DRIVE or use heavy machinery until tomorrow (because of the sedation medicines used during the test).    FOLLOW UP: Our staff will call the number listed on your records 48-72 hours following your procedure to check on you and address any questions or concerns that you may have regarding the information given to you following your procedure. If we do not reach you, we will leave a message.  We will attempt to reach you two times.  During this call, we will ask if you have developed any symptoms of COVID 19. If you develop any symptoms (ie: fever, flu-like symptoms, shortness of breath, cough etc.) before then, please call [phone].  If you test positive for Covid 19 in the 2 weeks post procedure, please call and report this information to [place].    If any biopsies were taken you will be contacted by phone or by letter within the next 1-3 weeks.  Please call us at [phone] if you have not heard about the biopsies in 3 weeks.    SIGNATURES/CONFIDENTIALITY: You and/or your care partner have signed paperwork which will be entered into your electronic medical record.  These signatures attest to the fact that that the information above on your After Visit Summary has been reviewed and is understood.  Full responsibility of the confidentiality of  this discharge information lies with you and/or your care-partner.

## 2020-02-07 NOTE — Progress Notes (Signed)
PT taken to PACU. Monitors in place. VSS. Report given to RN. 

## 2020-02-09 ENCOUNTER — Telehealth: Payer: Self-pay | Admitting: *Deleted

## 2020-02-09 NOTE — Telephone Encounter (Signed)
  Follow up Call-  Call back number 02/07/2020 07/30/2019  Post procedure Call Back phone  # [phone]  Permission to leave phone message Yes Yes  Some recent data might be hidden     Patient questions:  Do you have a fever, pain , or abdominal swelling? No. Pain Score  0 *  Have you tolerated food without any problems? Yes.    Have you been able to return to your normal activities? Yes.    Do you have any questions about your discharge instructions: Diet   No. Medications  No. Follow up visit  No.  Do you have questions or concerns about your Care? No.  Actions: 1. * If pain score is 4 or above:Have you developed a fever since your procedure? no  2.   Have you had an respiratory symptoms (SOB or cough) since your procedure? no  3.   Have you tested positive for COVID 19 since your procedure no  4.   Have you had any family members/close contacts diagnosed with the COVID 19 since your procedure?  no   If yes to any of these questions please route to Laverna Peace, RN and Charlett Lango, RN No action needed, pain <4.

## 2020-02-24 ENCOUNTER — Telehealth: Payer: Self-pay | Admitting: Nurse Practitioner

## 2020-02-24 NOTE — Telephone Encounter (Signed)
Colonoscopy went very well Doubt if symptoms are related to colonoscopy.  For coating of the tongue-if she can see Dr. Shary Decamp or Alona Bene to determine if she has fungal infection which may require more nystatin or Diflucan. For abdominal discomfort-please get x-ray KUB 2 view done.  Thx  RG

## 2020-02-24 NOTE — Telephone Encounter (Signed)
Called and spoke with patient-patient reports symptoms started about a week after the colonoscopy-"going on for about a week-the abdominal pressure and the raw tongue has been getting worse-eating anything causes nausea-"I am having to eat like a bird"; eating a bland diet;   Patient reports the coating on my tongue is "creamy colored"; when I brush my tongue it does not really come off and it is mainly on the sides and back of my tongue"-patient reports she did take one dose of Nystatin that she had left over from "earlier when in the year"; has been taking Pepcid AC QHS and Protonix QAM  And a probiotic QHS prescribed by PCP-  Please advise

## 2020-02-25 NOTE — Telephone Encounter (Signed)
Called and spoke with patient-patient informed of MD recommendations; patient is agreeable with plan of care and will follow up with her PCP as recommended; Patient verbalized understanding of information/instructions;  Patient was advised to call the office at [phone] if questions/concerns arise;

## 2020-02-28 ENCOUNTER — Other Ambulatory Visit: Payer: Self-pay | Admitting: Gastroenterology

## 2020-02-28 ENCOUNTER — Other Ambulatory Visit: Payer: Self-pay | Admitting: Neurology

## 2020-03-08 ENCOUNTER — Encounter (HOSPITAL_COMMUNITY): Payer: Self-pay | Admitting: Emergency Medicine

## 2020-03-08 ENCOUNTER — Other Ambulatory Visit: Payer: Self-pay

## 2020-03-08 ENCOUNTER — Emergency Department (HOSPITAL_COMMUNITY)
Admission: EM | Admit: 2020-03-08 | Discharge: 2020-03-08 | Disposition: A | Discharge status: Home / Self Care | Payer: BC Managed Care – PPO | Attending: Emergency Medicine | Admitting: Emergency Medicine

## 2020-03-08 DIAGNOSIS — Z5321 Procedure and treatment not carried out due to patient leaving prior to being seen by health care provider: Secondary | ICD-10-CM | POA: Diagnosis not present

## 2020-03-08 DIAGNOSIS — M545 Low back pain: Secondary | ICD-10-CM | POA: Diagnosis present

## 2020-03-08 NOTE — ED Triage Notes (Signed)
Pt reports hx issues with herniated disc. Reports got up today and left lower pains are severe going down left leg and couldn't walk. Reports scheduled to see spine specialist tomorrow. Reports can normally lay down and get relief esp on right side, but not helping today nor pain meds that had left over from recent dental surgery

## 2020-03-12 ENCOUNTER — Emergency Department (HOSPITAL_COMMUNITY)
Admission: EM | Admit: 2020-03-12 | Discharge: 2020-03-12 | Disposition: A | Discharge status: Home / Self Care | Payer: BC Managed Care – PPO | Attending: Emergency Medicine | Admitting: Emergency Medicine

## 2020-03-12 ENCOUNTER — Emergency Department (HOSPITAL_COMMUNITY): Payer: BC Managed Care – PPO

## 2020-03-12 ENCOUNTER — Encounter (HOSPITAL_COMMUNITY): Payer: Self-pay | Admitting: Emergency Medicine

## 2020-03-12 ENCOUNTER — Other Ambulatory Visit: Payer: Self-pay

## 2020-03-12 DIAGNOSIS — Z87891 Personal history of nicotine dependence: Secondary | ICD-10-CM | POA: Diagnosis not present

## 2020-03-12 DIAGNOSIS — M545 Low back pain, unspecified: Secondary | ICD-10-CM

## 2020-03-12 DIAGNOSIS — Z79899 Other long term (current) drug therapy: Secondary | ICD-10-CM | POA: Diagnosis not present

## 2020-03-12 DIAGNOSIS — E039 Hypothyroidism, unspecified: Secondary | ICD-10-CM | POA: Insufficient documentation

## 2020-03-12 DIAGNOSIS — R202 Paresthesia of skin: Secondary | ICD-10-CM | POA: Insufficient documentation

## 2020-03-12 MED ORDER — DIAZEPAM 5 MG PO TABS
5.0000 mg | ORAL_TABLET | Freq: Once | ORAL | Status: AC
  Administered 2020-03-12: 5 mg via ORAL
  Filled 2020-03-12: qty 1

## 2020-03-12 MED ORDER — FENTANYL CITRATE (PF) 100 MCG/2ML IJ SOLN
50.0000 ug | Freq: Once | INTRAMUSCULAR | Status: AC
  Administered 2020-03-12: 50 ug via INTRAVENOUS
  Filled 2020-03-12: qty 2

## 2020-03-12 MED ORDER — LIDOCAINE 5 % EX PTCH: 1.0000 | MEDICATED_PATCH | CUTANEOUS | 0 refills | Status: DC

## 2020-03-12 MED ORDER — DEXAMETHASONE SODIUM PHOSPHATE 10 MG/ML IJ SOLN
10.0000 mg | Freq: Once | INTRAMUSCULAR | Status: AC
  Administered 2020-03-12: 10 mg via INTRAVENOUS
  Filled 2020-03-12: qty 1

## 2020-03-12 MED ORDER — KETOROLAC TROMETHAMINE 15 MG/ML IJ SOLN
15.0000 mg | Freq: Once | INTRAMUSCULAR | Status: AC
  Administered 2020-03-12: 15 mg via INTRAVENOUS
  Filled 2020-03-12: qty 1

## 2020-03-12 MED ORDER — PREDNISONE 10 MG (21) PO TBPK: ORAL_TABLET | Freq: Every day | ORAL | 0 refills | Status: DC

## 2020-03-12 MED ORDER — HYDROCODONE-ACETAMINOPHEN 5-325 MG PO TABS: 1.0000 | ORAL_TABLET | Freq: Four times a day (QID) | ORAL | 0 refills | Status: DC | PRN

## 2020-03-12 MED ORDER — HYDROMORPHONE HCL 1 MG/ML IJ SOLN
0.5000 mg | Freq: Once | INTRAMUSCULAR | Status: AC
  Administered 2020-03-12: 0.5 mg via INTRAVENOUS
  Filled 2020-03-12: qty 1

## 2020-03-12 MED ORDER — OXYCODONE-ACETAMINOPHEN 5-325 MG PO TABS
1.0000 | ORAL_TABLET | Freq: Once | ORAL | Status: AC
  Administered 2020-03-12: 1 via ORAL
  Filled 2020-03-12: qty 1

## 2020-03-12 MED ORDER — LIDOCAINE 5 % EX PTCH
1.0000 | MEDICATED_PATCH | CUTANEOUS | Status: DC
  Administered 2020-03-12: 1 via TRANSDERMAL
  Filled 2020-03-12: qty 1

## 2020-03-12 NOTE — ED Provider Notes (Addendum)
[facility]   CSN: [id number] Arrival date & time: 03/12/20  [id number]     History Chief Complaint  Patient presents with  . Back Pain  . Leg Pain    Alison Curry is a 44 y.o. female with a history of migraines, depression, anxiety, depression, hyperlipidemia, and lumbar radiculopathy who presents to the ED with complaints of worsening back pain for the past couple of weeks. Patient states that she is having pain to the lower back/gluteal area bilaterally (L>R) with radiation into the LLE with associated intermittent paresthesias to all of her toes (bilateral) and feeling that her legs are weak, states she is unsure if this is more of a pain problem of if it is full weakness. Pain is constant, severe, worse with certain movements/positions, mildly alleviated with the Norco she was prescribed by her spine specialist. She states she has been taking a half tablet of the Norco as opposed to a full tablet every 4-6 hours with some relief. She is also taking naproxen as well. Seen by spine specialist 03/09/20, xray was obtained, and plan was for outpatient MRI. Given patient felt her pain was getting worse she came to the ED. Has had prior MRIs which have showed herniated disc in the past, she has received prior steroid injections. Denies saddle anesthesia, incontinence to bowel/bladder, fever, chills, IV drug use, dysuria, or hx of cancer. Patient has not had prior back surgeries. She has been told she has a herniated disc previously.   HPI     Past Medical History:  Diagnosis Date  . Allergy   . Anemia    past hx   . Anxiety and depression   . Cervical herniated disc   . Constipation    on linzess   . GERD (gastroesophageal reflux disease)   . Hypothyroidism   . Lumbar herniated disc   . Menometrorrhagia   . Migraines   . PID (pelvic inflammatory disease)     Patient Active Problem List   Diagnosis Date Noted  . RUQ pain 12/10/2019    . Constipation 12/10/2019  . Iron deficiency anemia 07/22/2019  . Malaise and fatigue 07/22/2019  . Mixed hyperlipidemia 03/31/2019  . Tinnitus of both ears 11/10/2018  . Lumbar radiculopathy 01/28/2017  . Acquired hypothyroidism 08/27/2016  . Acute upper respiratory infection 12/18/2015  . Allergic rhinitis 12/18/2015  . Anxiety disorder 12/18/2015  . B-complex deficiency 12/18/2015  . Carpal tunnel syndrome 12/18/2015  . Chronic reflux esophagitis 12/18/2015  . Herpes simplex type 1 infection 12/18/2015  . Hypoglycemia 12/18/2015  . Major depressive disorder, recurrent (HCC) 12/18/2015  . Trigeminal neuralgia 12/18/2015  . Vitamin D deficiency 12/18/2015  . Chronic migraine 12/18/2015  . GERD with esophagitis 12/18/2015  . Tobacco abuse 10/27/2015  . Intractable chronic migraine without aura and without status migrainosus 07/21/2015  . Neck pain 07/21/2015  . Medication overuse headache 07/21/2015    Past Surgical History:  Procedure Laterality Date  . ABLATION    . CARPAL TUNNEL RELEASE Right   . CESAREAN SECTION     x 2  . ESOPHAGOGASTRODUODENOSCOPY  02/01/2002   Mild Candida esophagitis. Mild gastritis.   . TOOTH EXTRACTION    . TUBAL LIGATION    . Tummy Tuck    . UPPER GASTROINTESTINAL ENDOSCOPY       OB History   No obstetric history on file.     Family History  Problem Relation Age of Onset  . Osteoporosis Mother   .  Atrial fibrillation Mother   . Heart disease Mother   . Colon cancer Neg Hx   . Esophageal cancer Neg Hx   . Stomach cancer Neg Hx   . Colon polyps Neg Hx   . Rectal cancer Neg Hx     Social History   Tobacco Use  . Smoking status: Former Smoker    Types: Cigarettes    Quit date: 07/28/2009    Years since quitting: 10.6  . Smokeless tobacco: Never Used  . Tobacco comment: socially  Substance Use Topics  . Alcohol use: Yes    Alcohol/week: 0.0 standard drinks    Comment: socially  . Drug use: No    Home Medications Prior  to Admission medications   Medication Sig Start Date End Date Taking? Authorizing Provider  acyclovir (ZOVIRAX) 400 MG tablet Take 400 mg by mouth 2 (two) times daily as needed. 05/29/15   [provider]  AIMOVIG 70 MG/ML SOAJ INJECT 70 MG INTO THE SKIN EVERY 30 (THIRTY) DAYS. 01/14/20   Pieter Partridge, DO  ALPRAZolam Duanne Moron) 0.25 MG tablet Take 0.25 mg by mouth every 12 (twelve) hours. 08/03/15   [provider]  amphetamine-dextroamphetamine (ADDERALL XR) 5 MG 24 hr capsule Take 5 mg by mouth daily. 06/21/19   [provider]  cyanocobalamin (,VITAMIN B-12,) 1000 MCG/ML injection Inject 1 mL into the muscle every 14 (fourteen) days. 05/03/15   [provider]  cyclobenzaprine (FLEXERIL) 10 MG tablet Take 1 tablet (10 mg total) by mouth 3 (three) times daily as needed for muscle spasms. 05/15/18   Pieter Partridge, DO  dexlansoprazole (DEXILANT) 60 MG capsule Take 1 capsule (60 mg total) by mouth daily. 08/03/19   Jackquline Denmark, MD  doxycycline (ORACEA) 40 MG capsule Take 40 mg by mouth every morning. As needed for skin    [provider]  Erenumab-aooe (AIMOVIG) 140 MG/ML SOAJ Inject 140 mg into the skin every 30 (thirty) days. 10/22/19   Tomi Likens, Adam R, DO  escitalopram (LEXAPRO) 10 MG tablet Take 1 tablet by mouth daily.    [provider]  famotidine (PEPCID) 20 MG tablet Take 20 mg by mouth 2 (two) times daily. 11/03/19   [provider]  ferrous sulfate 325 (65 FE) MG tablet Take 325 mg by mouth daily with breakfast.    [provider]  fexofenadine (ALLEGRA) 180 MG tablet Take 180 mg by mouth daily.    [provider]  HYDROcodone-acetaminophen (NORCO/VICODIN) 5-325 MG tablet Take 1 tablet by mouth every 6 (six) hours as needed. 11/16/19   Gregor Hams, MD  levothyroxine (SYNTHROID) 112 MCG tablet Take 112 mcg by mouth daily before breakfast.    [provider]  LINZESS 72 MCG capsule TAKE 1 CAPSULE (72 MCG TOTAL) BY  MOUTH DAILY. 02/28/20   Jackquline Denmark, MD  naproxen sodium (ANAPROX) 550 MG tablet TAKE 1 TABLET BY MOUTH EVERY 12 HOURS AS NEEDED 02/28/20   Pieter Partridge, DO  nystatin (MYCOSTATIN) 100000 UNIT/ML suspension Take 5 mLs (500,000 Units total) by mouth 2 (two) times daily. 08/04/19   Jackquline Denmark, MD  Omega-3 Fatty Acids (FISH OIL) 1000 MG CAPS Take by mouth.    [provider]  rizatriptan (MAXALT) 10 MG tablet TAKE 1 TABLET (10 MG TOTAL) BY MOUTH ONCE AS NEEDED. MAY TAKE A SECOND DOSE AFTER 2 HOURS IF NEEDED 03/08/19   Tomi Likens, Adam R, DO  sucralfate (CARAFATE) 1 g tablet Take 1 tablet (1 g total) by  mouth 2 (two) times daily for 10 days. 12/10/19 12/20/19  Lynann Bologna, MD  SUMAtriptan (TOSYMRA) 10 MG/ACT SOLN Place 1 spray into the nose every hour as needed (maximum 3 sprays in 24 hours). 12/14/18   Drema Dallas, DO  Vitamin D, Ergocalciferol, (DRISDOL) 50000 units CAPS capsule Take 1 capsule by mouth once a week. 03/11/18   [provider]    Allergies    Bupropion and Cephalexin  Review of Systems   Review of Systems  Constitutional: Negative for chills and fever.  Gastrointestinal: Negative for abdominal pain, nausea and vomiting.  Genitourinary: Negative for dysuria, hematuria and urgency.  Musculoskeletal: Positive for back pain and myalgias.  Neurological: Positive for weakness. Negative for numbness.       Positive for intermittent paresthesias to the toes. Negative for incontinence/retention or saddle anesthesia.     Physical Exam Updated Vital Signs BP 110/75 (BP Location: Left Arm)   Pulse 75   Temp 98.1 F (36.7 C) (Oral)   Resp 16   Ht 5\' 1"  (1.549 m)   Wt 59 kg   SpO2 98%   BMI 24.56 kg/m   Physical Exam Vitals and nursing note reviewed.  Constitutional:      General: She is not in acute distress.    Appearance: She is well-developed. She is not toxic-appearing.  HENT:     Head: Normocephalic and atraumatic.  Eyes:     General:        Right eye: No  discharge.        Left eye: No discharge.     Conjunctiva/sclera: Conjunctivae normal.  Cardiovascular:     Rate and Rhythm: Normal rate and regular rhythm.     Comments: 2+ symmetric DP/PT pulses bilaterally.  Pulmonary:     Effort: Pulmonary effort is normal. No respiratory distress.     Breath sounds: Normal breath sounds. No wheezing, rhonchi or rales.  Abdominal:     General: There is no distension.     Palpations: Abdomen is soft.     Tenderness: There is no abdominal tenderness.  Musculoskeletal:     Cervical back: Normal range of motion and neck supple. No spinous process tenderness or muscular tenderness.     Right lower leg: No edema.     Left lower leg: No edema.     Comments: No obvious deformity, appreciable swelling, erythema, ecchymosis, significant open wounds, or increased warmth.  Extremities: Normal ROM. Nontender. Compartments are soft. < 2 second cap refill to all LE digits.  Back: No point/focal vertebral tenderness, no palpable step off or crepitus. Patient tender to palpation to the left > right gluteal region.   Skin:    General: Skin is warm and dry.     Capillary Refill: Capillary refill takes less than 2 seconds.     Findings: No rash.  Neurological:     Mental Status: She is alert.     Deep Tendon Reflexes:     Reflex Scores:      Patellar reflexes are 2+ on the right side and 2+ on the left side.    Comments: Sensation grossly intact to bilateral lower extremities. 5/5 symmetric strength with plantar/dorsiflexion bilaterally. Able to lift bilateral lower extremities off of the stretcher without assistance. Gait is intact.   Psychiatric:        Behavior: Behavior normal.    ED Results / Procedures / Treatments   Labs (all labs ordered are listed, but only abnormal results are displayed) Labs  Reviewed - No data to display  EKG None  Radiology DG Lumbar Spine Complete  Result Date: 03/12/2020 CLINICAL DATA:  Low back pain EXAM: LUMBAR SPINE -  COMPLETE 4+ VIEW COMPARISON:  None. FINDINGS: There is no evidence of lumbar spine fracture. Alignment is normal. Intervertebral disc spaces are maintained. IMPRESSION: Negative. Electronically Signed   By: Deatra Robinson M.D.   On: 03/12/2020 05:35   MR LUMBAR SPINE WO CONTRAST  Result Date: 03/12/2020 CLINICAL DATA:  Low back and left leg pain EXAM: MRI LUMBAR SPINE WITHOUT CONTRAST TECHNIQUE: Multiplanar, multisequence MR imaging of the lumbar spine was performed. No intravenous contrast was administered. COMPARISON:  07/03/2019 FINDINGS: Segmentation: Transitional anatomy at the lumbosacral junction. Numbering is kept consistent with the prior study. By this convention, there is pseudoarthrosis of the right L5 transverse process with the sacrum. Alignment:  Stable.  No significant listhesis. Vertebrae: Stable vertebral body heights. No significant marrow edema. No suspicious osseous lesion. Conus medullaris and cauda equina: Conus extends to the T12-L1 level. Conus and cauda equina appear normal. Paraspinal and other soft tissues: Unremarkable. Disc levels: There is no disc herniation or stenosis from L1-L2 through L3-L4 and L5-S1. L4-L5: Disc desiccation and narrowing. Disc bulge with superimposed large central disc extrusion. This has progressed since the prior study. Similar moderate canal stenosis. There is persistent compression of the traversing right L5 nerve root and new compression of the traversing left L5 nerve root. Similar minor foraminal stenosis. IMPRESSION: Progression of disc herniation at L4-L5 now with compression of bilateral traversing L5 nerve roots. Electronically Signed   By: Guadlupe Spanish M.D.   On: 03/12/2020 11:51    Procedures Procedures (including critical care time)  Medications Ordered in ED Medications  lidocaine (LIDODERM) 5 % 1 patch (1 patch Transdermal Patch Applied 03/12/20 0813)  diazepam (VALIUM) tablet 5 mg (5 mg Oral Given 03/12/20 0813)  oxyCODONE-acetaminophen  (PERCOCET/ROXICET) 5-325 MG per tablet 1 tablet (1 tablet Oral Given 03/12/20 0813)  fentaNYL (SUBLIMAZE) injection 50 mcg (50 mcg Intravenous Given 03/12/20 1201)  ketorolac (TORADOL) 15 MG/ML injection 15 mg (15 mg Intravenous Given 03/12/20 1335)  dexamethasone (DECADRON) injection 10 mg (10 mg Intravenous Given 03/12/20 1336)  HYDROmorphone (DILAUDID) injection 0.5 mg (0.5 mg Intravenous Given 03/12/20 1334)    ED Course  I have reviewed the triage vital signs and the nursing notes.  Pertinent labs & imaging results that were available during my care of the patient were reviewed by me and considered in my medical decision making (see chart for details).    MDM Rules/Calculators/A&P                     Patient presents to the ED with complaints of lower back pain radiating into the LLE with associated digit paresthesias bilaterally as well as reports of weakness in the legs at times. She is nontoxic, resting comfortably, vitals WNL. Afebrile, no hx of IVDU, do not suspect epidural abscess. Given age with back pain feel MS would be less likely. Patient is requesting MRI in the ED, we discussed risks/benefits of this and she would like to proceed, given she is reporting subjective weakness feel this is reasonable, however no objective weakness on exam.   MRI Lumbar spine wo contrast: Progression of disc herniation at L4-L5 now with compression of bilateral traversing L5 nerve roots--> I have personally reviewed & interpreted imaging, I am in agreement with radiologist read.   12:23: CONSULT: Discussed with neurosurgeon Dr. Maurice Small, in  agreement with plan for steroids and outpatient follow up. Appreciate consultation.   13:00: RE-EVAL: Patient reports some relief, but persistent pain. Decadron, toradol, and dilaudid.   Improvement in pain, will discharge home with steroid taper and lidoderm patch, continue norco at home, outpatient follow up. I discussed results, treatment plan, need for follow-up,  and return precautions with the patient. Provided opportunity for questions, patient confirmed understanding and is in agreement with plan.   Findings and plan of care discussed with supervising physician Dr. Rush Landmark who is in agreement.   Final Clinical Impression(s) / ED Diagnoses Final diagnoses:  Acute low back pain, unspecified back pain laterality, unspecified whether sciatica present    Rx / DC Orders ED Discharge Orders         Ordered    lidocaine (LIDODERM) 5 %  Every 24 hours     03/12/20 1438    predniSONE (STERAPRED UNI-PAK 21 TAB) 10 MG (21) TBPK tablet  Daily     03/12/20 1438           Arman Loy R, PA-C 03/12/20 1439  Patient only has 1 or 2 tablets of Norco remaining, she is concerned about pain control, will provide an additional short course of this medication.    Alison Anderson, PA-C 03/12/20 1538    Tegeler, Canary Brim, MD 03/12/20 [phone]

## 2020-03-12 NOTE — Discharge Instructions (Addendum)
You were seen in the emergency department today for back pain.  Your MRI showed some worsening of your herniated disc at L4-L5 with compression of the nerve roots.  We suspect this is what is causing your symptoms.  We are sending you home with prednisone to take as a taper to help with inflammation around the nerve as well as Lidoderm patches to place 1 patch directly over your area of my significant pain once per day. We have also refilled your Norco prescription.   -Norco this is a narcotic/controlled substance medication that has potential addicting qualities.  We recommend that you take 1-2 tablets every 6 hours as needed for severe pain.  Do not drive or operate heavy machinery when taking this medicine as it can be sedating. Do not drink alcohol or take other sedating medications when taking this medicine for safety reasons.  Keep this out of reach of small children.  Please be aware this medicine has Tylenol in it (325 mg/tab) do not exceed the maximum dose of Tylenol in a day per over the counter recommendations should you decide to supplement with Tylenol over the counter.    We have prescribed you new medication(s) today. Discuss the medications prescribed today with your pharmacist as they can have adverse effects and interactions with your other medicines including over the counter and prescribed medications. Seek medical evaluation if you start to experience new or abnormal symptoms after taking one of these medicines, seek care immediately if you start to experience difficulty breathing, feeling of your throat closing, facial swelling, or rash as these could be indications of a more serious allergic reaction  Please follow-up with your spine specialist within 3 days.  Return to the emergency department for new or worsening symptoms including but not limited to worsening pain, inability to walk, loss of control of bowel or bladder function, fever, worsening tingling, worsening weakness, or any  other concerns.

## 2020-03-12 NOTE — ED Triage Notes (Signed)
Pt c/o left lower back pain and left leg for about a week not getting any relief with pain medication given on the UC.

## 2020-03-16 ENCOUNTER — Telehealth: Payer: Self-pay | Admitting: Neurology

## 2020-03-16 NOTE — Telephone Encounter (Signed)
Pt had to cancel her May appt due to her having Back surgery and resch for 07-25-20. She wanted to let you know incase if she needs a refill before she comes in the office. Jaffe did not have anything in sooner than 07-25-20

## 2020-03-21 ENCOUNTER — Ambulatory Visit: Payer: BC Managed Care – PPO | Admitting: Neurology

## 2020-06-20 ENCOUNTER — Other Ambulatory Visit: Payer: Self-pay | Admitting: Neurology

## 2020-06-20 DIAGNOSIS — G43009 Migraine without aura, not intractable, without status migrainosus: Secondary | ICD-10-CM

## 2020-07-24 NOTE — Progress Notes (Signed)
NEUROLOGY FOLLOW UP OFFICE NOTE  NELLI SWALLEY [id number]  HISTORY OF PRESENT ILLNESS: Alison Curry is a 44 year old right-handed woman who follows up for migraines.  UPDATE: Aimovig was increased back in December.  However, she reports that she still has been taking 70mg  despite placing a prescription for 140mg .  Usually doesn't treat them but will take a Maxalt if still there the next day.  Does not have Tosymra. Intensity:  7/10 Duration:  1 day. Frequency:  1 a week, sometimes 2 a week. She does wake up every morning with a headache that eases with naproxen.  She also has some neck pain which she treats with Flexeril at night, which has helped with the headaches. Rescue protocol: Naproxen or Tosymra. Current NSAIDS:Naproxen 550 mg Current analgesics:Rarely hydrocodone, lidoderm patch Current triptans:Tosymra; Maxalt (has not recently used) Current ergotamine:none Current anti-emetic:None Current muscle relaxants:Flexeril Current anti-anxiolytic:Xanax Current sleep aide:none Current Antihypertensive medications:none Current Antidepressant medications:Lexapro Current Anticonvulsant medications:none Current anti-CGRP:Aimovig 70mg  Current Vitamins/Herbal/Supplements:none Current Antihistamines/Decongestants:none Other therapy:None Birth control/hormone: None Other medication:Adderall  Depression and anxiety: Controlled Other pain:  Chronic joint pain.  Lumbar radiculopathy s/p L4-5 laminectomy and fusion in May 2021  HISTORY: Onset: 2013.She was initially diagnosed with trigeminal neuralgia, as it occurred on the right side following a root canal.Later, she was diagnosed with combination of trigeminal neuralgia and migraine.Workup included MRI of brain and cervical spine. Location: Started at base of the skull, radiating to the ears, jaw and head.Initially it was right sided, then became bilateral.She also reports neck  pain. Quality:Constant pain.It was not paroxysmal or electric Initial intensity:7-8/10, sometimes up to 10/10 Aura:sometimes sees orbs Prodrome:no Postdrome: Head is sore/scalp tingles Associated symptoms: Nausea when severe.  Initial Duration:Over a day Initial Frequency:25 headache days per month (8-10 severe migraine where she is incapacitated and nauseous) Triggers: Unknown  Past abortive medication:Advil, Aleve, Excedrin, Tylenol, Vicodin, Maxalt Past preventative medication:Nortriptyline, amitriptyline, propranolol, atenolol, Cymbalta, Zoloft (all with side effects), topiramate 100mg , Botox (effective but no longer able to afford it)  She had an MRI of the brain without and with contrast performed 11/15/12, which was normal.   PAST MEDICAL HISTORY: Past Medical History:  Diagnosis Date  . Allergy   . Anemia    past hx   . Anxiety and depression   . Cervical herniated disc   . Constipation    on linzess   . GERD (gastroesophageal reflux disease)   . Hypothyroidism   . Lumbar herniated disc   . Menometrorrhagia   . Migraines   . PID (pelvic inflammatory disease)     MEDICATIONS: Current Outpatient Medications on File Prior to Visit  Medication Sig Dispense Refill  . acyclovir (ZOVIRAX) 400 MG tablet Take 400 mg by mouth 2 (two) times daily as needed (for blisters).   10  . AIMOVIG 70 MG/ML SOAJ INJECT 70 MG INTO THE SKIN EVERY 30 (THIRTY) DAYS. 1 mL 0  . ALPRAZolam (XANAX) 0.25 MG tablet Take 0.25 mg by mouth daily as needed for anxiety or sleep.   2  . amphetamine-dextroamphetamine (ADDERALL XR) 15 MG 24 hr capsule Take 15 mg by mouth every morning. Takes 1 tablet by mouth ( Monday- Friday)    . amphetamine-dextroamphetamine (ADDERALL XR) 5 MG 24 hr capsule Take 5 mg by mouth daily. Takes only twice a week ( Saturday and Sunday)    . ciprofloxacin (CIPRO) 500 MG tablet Take 500 mg by mouth 2 (two) times daily. Start date : 03/01/20    .  cyanocobalamin (,VITAMIN B-12,) 1000 MCG/ML injection Inject 1 mL into the muscle every 14 (fourteen) days.  1  . cyclobenzaprine (FLEXERIL) 10 MG tablet Take 1 tablet (10 mg total) by mouth 3 (three) times daily as needed for muscle spasms. 90 tablet 3  . Erenumab-aooe (AIMOVIG) 140 MG/ML SOAJ Inject 140 mg into the skin every 30 (thirty) days. 1 pen 11  . escitalopram (LEXAPRO) 10 MG tablet Take 10 mg by mouth daily.     . famotidine (PEPCID) 20 MG tablet Take 20 mg by mouth 2 (two) times daily.    [name] HYDROcodone-acetaminophen (NORCO/VICODIN) 5-325 MG tablet Take 1-2 tablets by mouth every 6 (six) hours as needed. 10 tablet 0  . levothyroxine (SYNTHROID) 112 MCG tablet Take 112 mcg by mouth daily before breakfast.    . lidocaine (LIDODERM) 5 % Place 1 patch onto the skin daily. Apply to area of most significant pain in the back once per day. Remove within 12 hours. 30 patch 0  . LINZESS 72 MCG capsule TAKE 1 CAPSULE (72 MCG TOTAL) BY MOUTH DAILY. (Patient taking differently: Take 72 mcg by mouth every other day. ) 30 capsule 0  . naproxen sodium (ANAPROX) 550 MG tablet TAKE 1 TABLET BY MOUTH EVERY 12 HOURS AS NEEDED (Patient taking differently: Take 550 mg by mouth in the morning and at bedtime. ) 20 tablet 0  . predniSONE (STERAPRED UNI-PAK 21 TAB) 10 MG (21) TBPK tablet Take by mouth daily. 6, 5, 4, 3, 2, 1 take as written 21 tablet 0  . [DISCONTINUED] dexlansoprazole (DEXILANT) 60 MG capsule Take 1 capsule (60 mg total) by mouth daily. (Patient not taking: Reported on 03/12/2020) 30 capsule 6  . [DISCONTINUED] rizatriptan (MAXALT) 10 MG tablet TAKE 1 TABLET (10 MG TOTAL) BY MOUTH ONCE AS NEEDED. MAY TAKE A SECOND DOSE AFTER 2 HOURS IF NEEDED (Patient not taking: Reported on 03/12/2020) 10 tablet 6  . [DISCONTINUED] sucralfate (CARAFATE) 1 g tablet Take 1 tablet (1 g total) by mouth 2 (two) times daily for 10 days. (Patient not taking: Reported on 03/12/2020) 20 tablet 0  . [DISCONTINUED] SUMAtriptan  (TOSYMRA) 10 MG/ACT SOLN Place 1 spray into the nose every hour as needed (maximum 3 sprays in 24 hours). (Patient not taking: Reported on 03/12/2020) 9 each 3   No current facility-administered medications on file prior to visit.    ALLERGIES: Allergies  Allergen Reactions  . Bupropion Other (See Comments)    Nervousness  . Cephalexin Other (See Comments)    nervousness    FAMILY HISTORY: Family History  Problem Relation Age of Onset  . Osteoporosis Mother   . Atrial fibrillation Mother   . Heart disease Mother   . Colon cancer Neg Hx   . Esophageal cancer Neg Hx   . Stomach cancer Neg Hx   . Colon polyps Neg Hx   . Rectal cancer Neg Hx     SOCIAL HISTORY: Social History   Socioeconomic History  . Marital status: Legally Separated    Spouse name: Not on file  . Number of children: Not on file  . Years of education: Not on file  . Highest education level: Not on file  Occupational History  . Not on file  Tobacco Use  . Smoking status: Former Smoker    Types: Cigarettes    Quit date: 07/28/2009    Years since quitting: 10.9  . Smokeless tobacco: Never Used  . Tobacco comment: socially  Vaping Use  . Vaping Use:  Never used  Substance and Sexual Activity  . Alcohol use: Yes    Alcohol/week: 0.0 standard drinks    Comment: socially  . Drug use: No  . Sexual activity: Not on file  Other Topics Concern  . Not on file  Social History Narrative   Right handed   Lives in single story home with 2 sons   Social Determinants of Health   Financial Resource Strain:   . Difficulty of Paying Living Expenses: Not on file  Food Insecurity:   . Worried About Programme researcher, broadcasting/film/video in the Last Year: Not on file  . Ran Out of Food in the Last Year: Not on file  Transportation Needs:   . Lack of Transportation (Medical): Not on file  . Lack of Transportation (Non-Medical): Not on file  Physical Activity:   . Days of Exercise per Week: Not on file  . Minutes of Exercise per  Session: Not on file  Stress:   . Feeling of Stress : Not on file  Social Connections:   . Frequency of Communication with Friends and Family: Not on file  . Frequency of Social Gatherings with Friends and Family: Not on file  . Attends Religious Services: Not on file  . Active Member of Clubs or Organizations: Not on file  . Attends Banker Meetings: Not on file  . Marital Status: Not on file  Intimate Partner Violence:   . Fear of Current or Ex-Partner: Not on file  . Emotionally Abused: Not on file  . Physically Abused: Not on file  . Sexually Abused: Not on file    PHYSICAL EXAM: Blood pressure 119/81, pulse (!) 105, height 5\' 1"  (1.549 m), weight 127 lb (57.6 kg), SpO2 99 %. General: No acute distress.  Patient appears well-groomed.   Head:  Normocephalic/atraumatic Eyes:  Fundi examined but not visualized Neck: supple, no paraspinal tenderness, full range of motion Heart:  Regular rate and rhythm Lungs:  Clear to auscultation bilaterally Back: No paraspinal tenderness Neurological Exam: alert and oriented to person, place, and time. Attention span and concentration intact, recent and remote memory intact, fund of knowledge intact.  Speech fluent and not dysarthric, language intact.  CN II-XII intact. Bulk and tone normal, muscle strength 5/5 throughout.  Sensation to light touch, temperature and vibration intact.  Deep tendon reflexes 2+ throughout, toes downgoing.  Finger to nose and heel to shin testing intact.  Gait normal, Romberg negative.  IMPRESSION: Migraine with aura, without status migrainosus, not intractable  PLAN: 1.  For preventative management, increase Aimovig to 140mg  every 28 days 2.  For abortive therapy, refill Tosymra (discontinue Maxalt) 3.  Limit use of pain relievers to no more than 2 days out of week to prevent risk of rebound or medication-overuse headache. 4.  Keep headache diary 5.  Exercise, hydration, caffeine cessation, sleep  hygiene, monitor for and avoid triggers 6. Follow up 6 months.   , DO  CC:  , MD

## 2020-07-25 ENCOUNTER — Ambulatory Visit (INDEPENDENT_AMBULATORY_CARE_PROVIDER_SITE_OTHER): Payer: BC Managed Care – PPO | Admitting: Neurology

## 2020-07-25 ENCOUNTER — Encounter: Payer: Self-pay | Admitting: Neurology

## 2020-07-25 ENCOUNTER — Other Ambulatory Visit: Payer: Self-pay | Admitting: Neurology

## 2020-07-25 ENCOUNTER — Other Ambulatory Visit: Payer: Self-pay

## 2020-07-25 VITALS — BP 119/81 | HR 105 | Ht 61.0 in | Wt 127.0 lb

## 2020-07-25 DIAGNOSIS — G43109 Migraine with aura, not intractable, without status migrainosus: Secondary | ICD-10-CM | POA: Diagnosis not present

## 2020-07-25 MED ORDER — AIMOVIG 140 MG/ML ~~LOC~~ SOAJ: 140.0000 mg | SUBCUTANEOUS | 11 refills | Status: DC

## 2020-07-25 MED ORDER — TOSYMRA 10 MG/ACT NA SOLN: 10.0000 mg | NASAL | 5 refills | Status: DC | PRN

## 2020-07-25 NOTE — Patient Instructions (Signed)
  1. Increase Aimovig to 140mg  every 28 days. 2. Take Tosymra nasal spray at earliest onset of headache.  May repeat dose once in 1 hour if needed.  Maximum 2 sprays in 24 hours.  Expect test from [facility] mail order pharmacy.  Stop Maxalt 3. Limit use of pain relievers to no more than 2 days out of the week.  These medications include acetaminophen, NSAIDs (ibuprofen/Advil/Motrin, naproxen/Aleve, triptans (Imitrex/sumatriptan), Excedrin, and narcotics.  This will help reduce risk of rebound headaches. 4. Be aware of common food triggers:  - Caffeine:  coffee, black tea, cola, Mt. Dew  - Chocolate  - Dairy:  aged cheeses (brie, blue, cheddar, gouda, [place], provolone, [place], Swiss, etc), chocolate milk, buttermilk, sour cream, limit eggs and yogurt  - Nuts, peanut butter  - Alcohol  - Cereals/grains:  FRESH breads (fresh bagels, sourdough, doughnuts), yeast productions  - Processed/canned/aged/cured meats (pre-packaged deli meats, hotdogs)  - MSG/glutamate:  soy sauce, flavor enhancer, pickled/preserved/marinated foods  - Sweeteners:  aspartame (Equal, Nutrasweet).  Sugar and Splenda are okay  - Vegetables:  legumes (lima beans, lentils, snow peas, fava beans, pinto peans, peas, garbanzo beans), sauerkraut, onions, olives, pickles  - Fruit:  avocados, bananas, citrus fruit (orange, lemon, grapefruit), mango  - Other:  Frozen meals, macaroni and cheese 5. Routine exercise 6. Stay adequately hydrated (aim for 64 oz water daily) 7. Keep headache diary 8. Maintain proper stress management 9. Maintain proper sleep hygiene 10. Do not skip meals 11. Consider supplements:  magnesium citrate 400mg  daily, riboflavin 400mg  daily, coenzyme Q10 100mg  three times daily.

## 2020-07-26 ENCOUNTER — Other Ambulatory Visit: Payer: Self-pay | Admitting: Neurology

## 2020-07-26 MED ORDER — TOSYMRA 10 MG/ACT NA SOLN: 10.0000 mg | NASAL | 5 refills | Status: DC | PRN

## 2020-07-27 NOTE — Progress Notes (Signed)
Tere Richburg (Key: [id number])  Your information has been submitted to [employer]. To check for an updated outcome later, reopen this PA request from your dashboard.  If [employer] has not responded to your request within 24 hours, contact [employer] at [phone]. If you think there may be a problem with your PA request, use our live chat feature at the bottom right.

## 2020-07-31 NOTE — Progress Notes (Signed)
Alison Curry ([name]) Rx #: [id number] Aimovig 140MG /ML auto-injectors   Form [employer] Electronic PA Form (2017 NCPDP) Created 6 days ago Sent to Plan 4 days ago Plan Response 4 days ago Submit Clinical Questions 4 days ago Determination Favorable 2 days ago Message from 08-31-2005 Your PA request has been approved. Additional information will be provided in the approval communication. (Message 1145)  Approval valid from 07/27/20 to 10/26/20.

## 2020-08-03 ENCOUNTER — Encounter: Payer: Self-pay | Admitting: Neurology

## 2020-08-03 NOTE — Progress Notes (Signed)
Ruba Bagheri (Key: [id number]) Tosymra 10MG /ACT solution   Form PA Form (2017 NCPDP) Created 7 days ago Sent to Plan 2 days ago Plan Response 2 days ago Submit Clinical Questions 2 days ago Determination Unfavorable 1 day ago Message from 08-31-2005 Your PA request has been denied. Additional information will be provided in the denial communication. (Message 1140) Your PA request has been denied. Additional information will be provided in the denial communication. (Message 1140)  Sent appeal paperwork to [facility] for them to create appeal letter and send everything off to insurance company. Awaiting determination.

## 2020-08-23 NOTE — Progress Notes (Addendum)
Appeal is waiting on consent form from patient. Called patient on 10/11 to see if she received form in mail and could mail back to [place] or insurance company. Patient said she would check and call us back.   10/15- called patient back and she didn't have form so I mailed out the form for her to sign and mail back.   10/26- patient did receive consent form in mail. She has COVID and is waiting until she feels better to sign and send back.

## 2020-08-27 ENCOUNTER — Other Ambulatory Visit: Payer: Self-pay | Admitting: Neurology

## 2020-09-15 NOTE — Progress Notes (Signed)
11/2- received consent letter through fax so I faxed all the paperwork to the appeal department for them to start appeal process.

## 2020-10-26 ENCOUNTER — Encounter: Payer: Self-pay | Admitting: Neurology

## 2020-10-26 NOTE — Progress Notes (Signed)
Alison Curry (Key: [id number]) Aimovig 140MG /ML auto-injectors   Form [employer] Electronic PA Form (2017 NCPDP) Created 14 hours ago Sent to Plan 6 hours ago Plan Response 3 hours ago Submit Clinical Questions 3 hours ago Determination Favorable 1 hour ago Message from 08-31-2005 Your PA request has been approved. Approval valid from 10/26/20 to 10/26/21.

## 2020-11-30 ENCOUNTER — Other Ambulatory Visit: Payer: Self-pay | Admitting: Orthopedic Surgery

## 2020-12-06 ENCOUNTER — Telehealth: Payer: Self-pay | Admitting: Neurology

## 2020-12-06 ENCOUNTER — Other Ambulatory Visit: Payer: Self-pay | Admitting: Neurology

## 2020-12-06 MED ORDER — AJOVY 225 MG/1.5ML ~~LOC~~ SOAJ: 225.0000 mg | SUBCUTANEOUS | 5 refills | Status: DC

## 2020-12-06 NOTE — Telephone Encounter (Signed)
Patient has tried to pick up her Aimovig, but it's over $600. She was told she needed to renew her ID number. She has called and been put on a wait list and it's been over a week and she hasn't heard anything. She is not sure what she should do? Her headaches are coming back because she has been without it.

## 2020-12-06 NOTE — Telephone Encounter (Signed)
Pt advised her insurance will not cover Aimovig at all.  If pt likes DR.Everlena Cooper can send in Ajovy an injection medication that her insurance will cover.  Pt will like that sent into her pharmacy.

## 2020-12-06 NOTE — Telephone Encounter (Signed)
Done

## 2020-12-13 ENCOUNTER — Encounter (HOSPITAL_BASED_OUTPATIENT_CLINIC_OR_DEPARTMENT_OTHER): Payer: Self-pay | Admitting: Orthopedic Surgery

## 2020-12-14 ENCOUNTER — Other Ambulatory Visit (HOSPITAL_COMMUNITY): Payer: BC Managed Care – PPO

## 2020-12-16 ENCOUNTER — Other Ambulatory Visit (HOSPITAL_COMMUNITY)
Admission: RE | Admit: 2020-12-16 | Discharge: 2020-12-16 | Disposition: A | Discharge status: Home / Self Care | Payer: BC Managed Care – PPO | Source: Ambulatory Visit | Attending: Orthopedic Surgery | Admitting: Orthopedic Surgery

## 2020-12-16 DIAGNOSIS — Z888 Allergy status to other drugs, medicaments and biological substances status: Secondary | ICD-10-CM | POA: Diagnosis not present

## 2020-12-16 DIAGNOSIS — G5602 Carpal tunnel syndrome, left upper limb: Secondary | ICD-10-CM | POA: Diagnosis not present

## 2020-12-16 DIAGNOSIS — Z881 Allergy status to other antibiotic agents status: Secondary | ICD-10-CM | POA: Diagnosis not present

## 2020-12-16 DIAGNOSIS — Z8249 Family history of ischemic heart disease and other diseases of the circulatory system: Secondary | ICD-10-CM | POA: Diagnosis not present

## 2020-12-16 DIAGNOSIS — Z8262 Family history of osteoporosis: Secondary | ICD-10-CM | POA: Diagnosis not present

## 2020-12-16 DIAGNOSIS — Z9889 Other specified postprocedural states: Secondary | ICD-10-CM | POA: Diagnosis not present

## 2020-12-16 DIAGNOSIS — Z20822 Contact with and (suspected) exposure to covid-19: Secondary | ICD-10-CM | POA: Diagnosis not present

## 2020-12-16 DIAGNOSIS — Z01812 Encounter for preprocedural laboratory examination: Secondary | ICD-10-CM | POA: Insufficient documentation

## 2020-12-16 DIAGNOSIS — E039 Hypothyroidism, unspecified: Secondary | ICD-10-CM | POA: Diagnosis not present

## 2020-12-16 DIAGNOSIS — Z87891 Personal history of nicotine dependence: Secondary | ICD-10-CM | POA: Diagnosis not present

## 2020-12-16 DIAGNOSIS — Z8349 Family history of other endocrine, nutritional and metabolic diseases: Secondary | ICD-10-CM | POA: Diagnosis not present

## 2020-12-16 LAB — SARS CORONAVIRUS 2 (TAT 6-24 HRS): SARS Coronavirus 2: NEGATIVE

## 2020-12-19 ENCOUNTER — Encounter (HOSPITAL_BASED_OUTPATIENT_CLINIC_OR_DEPARTMENT_OTHER)
Admission: RE | Disposition: A | Discharge status: Home / Self Care | Payer: Self-pay | Source: Home / Self Care | Attending: Orthopedic Surgery

## 2020-12-19 ENCOUNTER — Ambulatory Visit (HOSPITAL_BASED_OUTPATIENT_CLINIC_OR_DEPARTMENT_OTHER)
Admission: RE | Admit: 2020-12-19 | Discharge: 2020-12-19 | Disposition: A | Discharge status: Home / Self Care | Payer: BC Managed Care – PPO | Attending: Orthopedic Surgery | Admitting: Orthopedic Surgery

## 2020-12-19 ENCOUNTER — Ambulatory Visit (HOSPITAL_BASED_OUTPATIENT_CLINIC_OR_DEPARTMENT_OTHER): Payer: BC Managed Care – PPO | Admitting: Anesthesiology

## 2020-12-19 ENCOUNTER — Other Ambulatory Visit: Payer: Self-pay

## 2020-12-19 ENCOUNTER — Encounter (HOSPITAL_BASED_OUTPATIENT_CLINIC_OR_DEPARTMENT_OTHER): Payer: Self-pay | Admitting: Orthopedic Surgery

## 2020-12-19 DIAGNOSIS — Z8349 Family history of other endocrine, nutritional and metabolic diseases: Secondary | ICD-10-CM | POA: Insufficient documentation

## 2020-12-19 DIAGNOSIS — Z87891 Personal history of nicotine dependence: Secondary | ICD-10-CM | POA: Insufficient documentation

## 2020-12-19 DIAGNOSIS — Z8249 Family history of ischemic heart disease and other diseases of the circulatory system: Secondary | ICD-10-CM | POA: Insufficient documentation

## 2020-12-19 DIAGNOSIS — G5602 Carpal tunnel syndrome, left upper limb: Secondary | ICD-10-CM | POA: Diagnosis not present

## 2020-12-19 DIAGNOSIS — Z881 Allergy status to other antibiotic agents status: Secondary | ICD-10-CM | POA: Insufficient documentation

## 2020-12-19 DIAGNOSIS — Z8262 Family history of osteoporosis: Secondary | ICD-10-CM | POA: Insufficient documentation

## 2020-12-19 DIAGNOSIS — Z20822 Contact with and (suspected) exposure to covid-19: Secondary | ICD-10-CM | POA: Insufficient documentation

## 2020-12-19 DIAGNOSIS — Z9889 Other specified postprocedural states: Secondary | ICD-10-CM | POA: Insufficient documentation

## 2020-12-19 DIAGNOSIS — Z888 Allergy status to other drugs, medicaments and biological substances status: Secondary | ICD-10-CM | POA: Insufficient documentation

## 2020-12-19 DIAGNOSIS — E039 Hypothyroidism, unspecified: Secondary | ICD-10-CM | POA: Insufficient documentation

## 2020-12-19 HISTORY — PX: CARPAL TUNNEL RELEASE: SHX101

## 2020-12-19 LAB — POCT PREGNANCY, URINE: Preg Test, Ur: NEGATIVE

## 2020-12-19 SURGERY — CARPAL TUNNEL RELEASE
Anesthesia: Regional | Site: Wrist | Laterality: Left

## 2020-12-19 MED ORDER — BUPIVACAINE HCL (PF) 0.25 % IJ SOLN
INTRAMUSCULAR | Status: AC
  Filled 2020-12-19: qty 30

## 2020-12-19 MED ORDER — FENTANYL CITRATE (PF) 100 MCG/2ML IJ SOLN
INTRAMUSCULAR | Status: AC
  Filled 2020-12-19: qty 2

## 2020-12-19 MED ORDER — 0.9 % SODIUM CHLORIDE (POUR BTL) OPTIME
TOPICAL | Status: DC | PRN
  Administered 2020-12-19: 120 mL

## 2020-12-19 MED ORDER — FENTANYL CITRATE (PF) 100 MCG/2ML IJ SOLN: 25.0000 ug | INTRAMUSCULAR | Status: DC | PRN

## 2020-12-19 MED ORDER — TRAMADOL HCL 50 MG PO TABS: 50.0000 mg | ORAL_TABLET | Freq: Four times a day (QID) | ORAL | 0 refills | Status: DC | PRN

## 2020-12-19 MED ORDER — ONDANSETRON HCL 4 MG/2ML IJ SOLN
INTRAMUSCULAR | Status: AC
  Filled 2020-12-19: qty 4

## 2020-12-19 MED ORDER — BUPIVACAINE HCL (PF) 0.25 % IJ SOLN
INTRAMUSCULAR | Status: DC | PRN
  Administered 2020-12-19: 6 mL

## 2020-12-19 MED ORDER — MIDAZOLAM HCL 2 MG/2ML IJ SOLN
INTRAMUSCULAR | Status: AC
  Filled 2020-12-19: qty 2

## 2020-12-19 MED ORDER — MIDAZOLAM HCL 2 MG/2ML IJ SOLN
INTRAMUSCULAR | Status: DC | PRN
  Administered 2020-12-19: 2 mg via INTRAVENOUS

## 2020-12-19 MED ORDER — CLINDAMYCIN PHOSPHATE 900 MG/50ML IV SOLN
INTRAVENOUS | Status: AC
  Filled 2020-12-19: qty 50

## 2020-12-19 MED ORDER — EPHEDRINE SULFATE 50 MG/ML IJ SOLN
INTRAMUSCULAR | Status: DC | PRN
  Administered 2020-12-19: 10 mg via INTRAVENOUS

## 2020-12-19 MED ORDER — LIDOCAINE HCL (PF) 0.5 % IJ SOLN
INTRAMUSCULAR | Status: DC | PRN
  Administered 2020-12-19: 25 mL via INTRAVENOUS

## 2020-12-19 MED ORDER — OXYCODONE HCL 5 MG/5ML PO SOLN: 5.0000 mg | Freq: Once | ORAL | Status: DC | PRN

## 2020-12-19 MED ORDER — FENTANYL CITRATE (PF) 100 MCG/2ML IJ SOLN
INTRAMUSCULAR | Status: DC | PRN
  Administered 2020-12-19: 25 ug via INTRAVENOUS
  Administered 2020-12-19: 50 ug via INTRAVENOUS

## 2020-12-19 MED ORDER — EPHEDRINE 5 MG/ML INJ
INTRAVENOUS | Status: AC
  Filled 2020-12-19: qty 10

## 2020-12-19 MED ORDER — PROMETHAZINE HCL 25 MG/ML IJ SOLN: 6.2500 mg | INTRAMUSCULAR | Status: DC | PRN

## 2020-12-19 MED ORDER — AMISULPRIDE (ANTIEMETIC) 5 MG/2ML IV SOLN: 10.0000 mg | Freq: Once | INTRAVENOUS | Status: DC | PRN

## 2020-12-19 MED ORDER — LACTATED RINGERS IV SOLN: INTRAVENOUS | Status: DC

## 2020-12-19 MED ORDER — PROPOFOL 10 MG/ML IV BOLUS
INTRAVENOUS | Status: DC | PRN
  Administered 2020-12-19: 10 mg via INTRAVENOUS

## 2020-12-19 MED ORDER — CLINDAMYCIN PHOSPHATE 900 MG/50ML IV SOLN
900.0000 mg | INTRAVENOUS | Status: AC
  Administered 2020-12-19: 900 mg via INTRAVENOUS

## 2020-12-19 MED ORDER — PROPOFOL 500 MG/50ML IV EMUL
INTRAVENOUS | Status: DC | PRN
  Administered 2020-12-19: 100 ug/kg/min via INTRAVENOUS

## 2020-12-19 MED ORDER — OXYCODONE HCL 5 MG PO TABS: 5.0000 mg | ORAL_TABLET | Freq: Once | ORAL | Status: DC | PRN

## 2020-12-19 MED ORDER — ONDANSETRON HCL 4 MG/2ML IJ SOLN
INTRAMUSCULAR | Status: DC | PRN
  Administered 2020-12-19: 4 mg via INTRAVENOUS

## 2020-12-19 SURGICAL SUPPLY — 40 items
APL PRP STRL LF DISP 70% ISPRP (MISCELLANEOUS) ×1
BLADE SURG 15 STRL LF DISP TIS (BLADE) ×1 IMPLANT
BLADE SURG 15 STRL SS (BLADE) ×2
BNDG CMPR 9X4 STRL LF SNTH (GAUZE/BANDAGES/DRESSINGS)
BNDG COHESIVE 3X5 TAN STRL LF (GAUZE/BANDAGES/DRESSINGS) ×2 IMPLANT
BNDG ESMARK 4X9 LF (GAUZE/BANDAGES/DRESSINGS) IMPLANT
BNDG GAUZE ELAST 4 BULKY (GAUZE/BANDAGES/DRESSINGS) ×2 IMPLANT
CHLORAPREP W/TINT 26 (MISCELLANEOUS) ×2 IMPLANT
CORD BIPOLAR FORCEPS 12FT (ELECTRODE) ×2 IMPLANT
COVER BACK TABLE 60X90IN (DRAPES) ×2 IMPLANT
COVER MAYO STAND STRL (DRAPES) ×2 IMPLANT
COVER WAND RF STERILE (DRAPES) IMPLANT
CUFF TOURN SGL QUICK 18X4 (TOURNIQUET CUFF) ×2 IMPLANT
DRAPE IMP U-DRAPE 54X76 (DRAPES) ×1 IMPLANT
DRAPE SURG 17X23 STRL (DRAPES) ×3 IMPLANT
DRAPE U-SHAPE 76X120 STRL (DRAPES) ×1 IMPLANT
DRSG PAD ABDOMINAL 8X10 ST (GAUZE/BANDAGES/DRESSINGS) ×2 IMPLANT
GAUZE SPONGE 4X4 12PLY STRL (GAUZE/BANDAGES/DRESSINGS) ×2 IMPLANT
GAUZE XEROFORM 1X8 LF (GAUZE/BANDAGES/DRESSINGS) ×2 IMPLANT
GLOVE SURG LTX SZ6.5 (GLOVE) ×1 IMPLANT
GLOVE SURG ORTHO LTX SZ8 (GLOVE) ×2 IMPLANT
GLOVE SURG SS PI 7.0 STRL IVOR (GLOVE) ×1 IMPLANT
GLOVE SURG UNDER POLY LF SZ6.5 (GLOVE) ×1 IMPLANT
GLOVE SURG UNDER POLY LF SZ7 (GLOVE) ×2 IMPLANT
GLOVE SURG UNDER POLY LF SZ8.5 (GLOVE) ×2 IMPLANT
GOWN STRL REUS W/ TWL LRG LVL3 (GOWN DISPOSABLE) ×1 IMPLANT
GOWN STRL REUS W/TWL 2XL LVL3 (GOWN DISPOSABLE) ×1 IMPLANT
GOWN STRL REUS W/TWL LRG LVL3 (GOWN DISPOSABLE) ×4
GOWN STRL REUS W/TWL XL LVL3 (GOWN DISPOSABLE) ×2 IMPLANT
NDL PRECISIONGLIDE 27X1.5 (NEEDLE) IMPLANT
NEEDLE PRECISIONGLIDE 27X1.5 (NEEDLE) ×2 IMPLANT
NS IRRIG 1000ML POUR BTL (IV SOLUTION) ×2 IMPLANT
PACK BASIN DAY SURGERY FS (CUSTOM PROCEDURE TRAY) ×2 IMPLANT
STOCKINETTE 4X48 STRL (DRAPES) ×2 IMPLANT
SUT ETHILON 4 0 PS 2 18 (SUTURE) ×2 IMPLANT
SUT VICRYL 4-0 PS2 18IN ABS (SUTURE) IMPLANT
SYR BULB EAR ULCER 3OZ GRN STR (SYRINGE) ×2 IMPLANT
SYR CONTROL 10ML LL (SYRINGE) ×1 IMPLANT
TOWEL GREEN STERILE FF (TOWEL DISPOSABLE) ×2 IMPLANT
UNDERPAD 30X36 HEAVY ABSORB (UNDERPADS AND DIAPERS) ×2 IMPLANT

## 2020-12-19 NOTE — Brief Op Note (Signed)
12/19/2020  1:09 PM  PATIENT:  Alison Curry  45 y.o. female  PRE-OPERATIVE DIAGNOSIS:  LEFT CARPAL TUNNEL SYNDROME  POST-OPERATIVE DIAGNOSIS:  LEFT CARPAL TUNNEL SYNDROME  PROCEDURE:  Procedure(s): CARPAL TUNNEL RELEASE (Left)  SURGEON:  Surgeon(s) and Role:    * Cindee Salt, MD - Primary  PHYSICIAN ASSISTANT:   ASSISTANTS: none   ANESTHESIA:   local, regional and IV sedation  EBL:  2 mL   BLOOD ADMINISTERED:none  DRAINS: none   LOCAL MEDICATIONS USED:  BUPIVICAINE   SPECIMEN:  No Specimen  DISPOSITION OF SPECIMEN:  N/A  COUNTS:  YES  TOURNIQUET:   Total Tourniquet Time Documented: Forearm (Left) - 20 minutes Total: Forearm (Left) - 20 minutes   DICTATION: .Dragon Dictation  PLAN OF CARE: Discharge to home after PACU  PATIENT DISPOSITION:  PACU - hemodynamically stable.

## 2020-12-19 NOTE — Anesthesia Preprocedure Evaluation (Addendum)
Anesthesia Evaluation  Patient identified by MRN, date of birth, ID band Patient awake    Reviewed: Allergy & Precautions, H&P , NPO status , Patient's Chart, lab work & pertinent test results  Airway Mallampati: I  TM Distance: >3 FB Neck ROM: Full    Dental no notable dental hx.    Pulmonary neg pulmonary ROS, former smoker,    Pulmonary exam normal breath sounds clear to auscultation       Cardiovascular negative cardio ROS Normal cardiovascular exam Rhythm:Regular Rate:Normal     Neuro/Psych  Headaches, PSYCHIATRIC DISORDERS Anxiety Depression  Neuromuscular disease (carpal tunnel; trigeminal neuralgia)    GI/Hepatic Neg liver ROS, GERD  ,  Endo/Other  Hypothyroidism   Renal/GU negative Renal ROS  negative genitourinary   Musculoskeletal negative musculoskeletal ROS (+)   Abdominal   Peds negative pediatric ROS (+)  Hematology   Anesthesia Other Findings   Reproductive/Obstetrics negative OB ROS                            Anesthesia Physical Anesthesia Plan  ASA: II  Anesthesia Plan: Bier Block and Bier Block-LIDOCAINE ONLY   Post-op Pain Management:    Induction: Intravenous  PONV Risk Score and Plan: 2 and Propofol infusion, Treatment may vary due to age or medical condition, TIVA and Ondansetron  Airway Management Planned: Natural Airway and Simple Face Mask  Additional Equipment:   Intra-op Plan:   Post-operative Plan:   Informed Consent: I have reviewed the patients History and Physical, chart, labs and discussed the procedure including the risks, benefits and alternatives for the proposed anesthesia with the patient or authorized representative who has indicated his/her understanding and acceptance.     Dental advisory given  Plan Discussed with: CRNA and Anesthesiologist  Anesthesia Plan Comments:         Anesthesia Quick Evaluation

## 2020-12-19 NOTE — Discharge Instructions (Addendum)

## 2020-12-19 NOTE — Transfer of Care (Signed)
Immediate Anesthesia Transfer of Care Note  Patient: Alison Curry  Procedure(s) Performed: CARPAL TUNNEL RELEASE (Left Wrist)  Patient Location: PACU  Anesthesia Type:Bier block  Level of Consciousness: drowsy  Airway & Oxygen Therapy: Patient Spontanous Breathing and Patient connected to face mask oxygen  Post-op Assessment: Report given to RN and Post -op Vital signs reviewed and stable  Post vital signs: Reviewed and stable  Last Vitals:  Vitals Value Taken Time  BP 114/69 12/19/20 1311  Temp    Pulse 62 12/19/20 1312  Resp 10 12/19/20 1312  SpO2 100 % 12/19/20 1312  Vitals shown include unvalidated device data.  Last Pain:  Vitals:   12/19/20 1117  TempSrc: Oral  PainSc: 3       Patients Stated Pain Goal: 3 (12/19/20 1117)  Complications: No complications documented.

## 2020-12-19 NOTE — Anesthesia Procedure Notes (Signed)
Anesthesia Regional Block: Bier block (IV Regional)   Pre-Anesthetic Checklist: ,, timeout performed, Correct Patient, Correct Site, Correct Laterality, Correct Procedure, Correct Position, site marked, Risks and benefits discussed, Surgical consent,  Pre-op evaluation,  At surgeon's request  Laterality: Left  Prep: Maximum Sterile Barrier Precautions used, alcohol swabs        Procedures:,,,,, intact distal pulses, Esmarch exsanguination, single tourniquet utilized,  Narrative:  CRNA: Lance Coon, CRNA

## 2020-12-19 NOTE — H&P (Signed)
Alison Curry is an 45 y.o. female.   Chief Complaint:numbness left hand OXB:[name] is a 45 year old hand dominant former patient who has not been seen since 2010. At that time she had carpal tunnel syndrome nerve conductions positive. Had a release of right carpal canal in 2010.Alison Curry She has had an injection to the left carpal tunnel at that time. He is complaining of numbness and tingling of her left hand and thumb middle and ring fingers. This been going on for a months. She states it has been intermittent since last being seen but has become painful. She states that the numbness and tingling is relatively constant. She is waking 7 out of 7 nights. She has felt his new history of injury to her hand. She is seeing spine surgeon for her low back and neck. She has been told she has just. She states nothing makes it better or worse. She is presently wearing a splint and taking prednisone. She has a history of thyroid problems no history of diabetes arthritis or gout. Family history is positive thyroid problems negative for the remainder. She has had nerve conductions by Dr. [name] revealing a carpal tunnel syndrome left side with a motor delay of 4 sensory delay of 3 8. She had an injection in the past  to the left carpal canal she had an excellent response for 6 months but she said it is back with a vengeance it is waking her every night. Complaining of a pain and numbness and tingling this in the median nerve distribution and also slightly over the [name] form. She has a history of thyroid problems no history of diabetes arthritis or gout. Family history history is positive for the same.   Past Medical History:  Diagnosis Date  . Allergy   . Anemia    past hx   . Anxiety and depression   . Cervical herniated disc   . Constipation    on linzess   . GERD (gastroesophageal reflux disease)   . Hypothyroidism   . Lumbar herniated disc   . Menometrorrhagia   . Migraines   . PID (pelvic inflammatory disease)      Past Surgical History:  Procedure Laterality Date  . ABLATION    . ANTERIOR CERVICAL DECOMPRESSION/DISCECTOMY FUSION 4 LEVEL/HARDWARE REMOVAL    . CARPAL TUNNEL RELEASE Right   . CESAREAN SECTION     x 2  . ESOPHAGOGASTRODUODENOSCOPY  02/01/2002   Mild Candida esophagitis. Mild gastritis.   . TOOTH EXTRACTION    . TUBAL LIGATION    . Tummy Tuck    . UPPER GASTROINTESTINAL ENDOSCOPY      Family History  Problem Relation Age of Onset  . Osteoporosis Mother   . Atrial fibrillation Mother   . Heart disease Mother   . Colon cancer Neg Hx   . Esophageal cancer Neg Hx   . Stomach cancer Neg Hx   . Colon polyps Neg Hx   . Rectal cancer Neg Hx    Social History:  reports that she quit smoking about 11 years ago. Her smoking use included cigarettes. She has never used smokeless tobacco. She reports current alcohol use. She reports that she does not use drugs.  Allergies:  Allergies  Allergen Reactions  . Bupropion Other (See Comments)    Nervousness  . Cephalexin Other (See Comments)    nervousness    No medications prior to admission.    No results found for this or any previous visit (from the past  48 hour(s)).  No results found.   Pertinent items are noted in HPI.  Height 5\' 1"  (1.549 m), weight 54.4 kg.  General appearance: alert, cooperative and appears stated age Head: Normocephalic, without obvious abnormality Neck: no JVD Resp: clear to auscultation bilaterally Cardio: regular rate and rhythm, S1, S2 normal, no murmur, click, rub or gallop GI: soft, non-tender; bowel sounds normal; no masses,  no organomegaly Extremities: numbness left hand Pulses: 2+ and symmetric Skin: Skin color, texture, turgor normal. No rashes or lesions Neurologic: Grossly normal Incision/Wound: na  Assessment/Plan  Diagnosis left carpal tunnel syndrome she has had her right side released in the past Plan: We have discussed proceeding to surgical decompression with her. She  would like to proceed. Preperi-and postoperative course been discussed along with risk complications. She is aware that there is no guarantee to the surgery the possibility of infection recurrence injury to arteries nerves tendons complete relief symptoms and dystrophy. She is scheduled for left carpal tunnel release in outpatient under regional anesthesia.      12/19/2020, 10:30 AM

## 2020-12-19 NOTE — Anesthesia Postprocedure Evaluation (Signed)
Anesthesia Post Note  Patient: Alison Curry  Procedure(s) Performed: CARPAL TUNNEL RELEASE (Left Wrist)     Patient location during evaluation: PACU Anesthesia Type: Bier Block and MAC Level of consciousness: awake and alert Pain management: pain level controlled Vital Signs Assessment: post-procedure vital signs reviewed and stable Respiratory status: spontaneous breathing, nonlabored ventilation, respiratory function stable and patient connected to nasal cannula oxygen Cardiovascular status: stable and blood pressure returned to baseline Postop Assessment: no apparent nausea or vomiting Anesthetic complications: no   No complications documented.  Last Vitals:  Vitals:   12/19/20 1327 12/19/20 1340  BP: 115/78 103/70  Pulse: (!) 58 86  Resp: 12 18  Temp:  36.4 C  SpO2: 100% 100%    Last Pain:  Vitals:   12/19/20 1340  TempSrc: Oral  PainSc: 0-No pain                 Merlinda Frederick

## 2020-12-20 ENCOUNTER — Encounter (HOSPITAL_BASED_OUTPATIENT_CLINIC_OR_DEPARTMENT_OTHER): Payer: Self-pay | Admitting: Orthopedic Surgery

## 2020-12-20 NOTE — Op Note (Signed)
NAME: Alison Curry MEDICAL RECORD NO: [id number] DATE OF BIRTH: July 13, 1976 FACILITY: [facility] LOCATION: [facility] PHYSICIAN: Nicki Reaper, MD   OPERATIVE REPORT   DATE OF PROCEDURE: 12/20/20    PREOPERATIVE DIAGNOSIS:   Carpal tunnel syndrome left hand   POSTOPERATIVE DIAGNOSIS:   Same   PROCEDURE:   Compression median nerve left hand   SURGEON: Cindee Salt, M.D.   ASSISTANT: none   ANESTHESIA:   Bier block with sedation and local   INTRAVENOUS FLUIDS:  Per anesthesia flow sheet.   ESTIMATED BLOOD LOSS:  Minimal.   COMPLICATIONS:  None.   SPECIMENS:  none   TOURNIQUET TIME:    Total Tourniquet Time Documented: Forearm (Left) - 20 minutes Total: Forearm (Left) - 20 minutes    DISPOSITION:  Stable to PACU.   INDICATIONS: Patient is a 45 year old female with a history of numbness and tingling of left hand.  This is not responded to conservative treatment.  Nerve conductions are positive.  She has had a carpal tunnel release on her opposite side done in the past.  She is desirous of proceeding with carpal tunnel release on her left side.  Preperi-and postoperative course been discussed along with risks and complications.  She is aware there is no guarantee to the surgery the possibility of infection recurrence injury to arteries nerves tendons complete relief symptoms dystrophy.  In preoperative area the patient is seen the extremity marked by both patient and surgeon antibiotic given  OPERATIVE COURSE: Patient is brought to the operating room placed in the supine position with left arm free.  Forearm-based IV regional anesthetic was carried out without difficulty under the direction the anesthesia department.  Prep was done with ChloraPrep a 3-minute dry time allowed and timeout taken to confirm patient procedure.  A longitudinal incision was made left palm carried down through subcutaneous tissue.  Bleeders were electrocauterized with bipolar.  Palmar fascia  was then split.  The superficial palmar arch was identified along with flexor tendon to the ring and little finger.  Retractors were placed retracting the flexor tendons median nerve radially ulnarly of ulnarly.  The flexor retinaculum was then released on its ulnar border.  Right angle and stool retractor placed between skin and forearm fascia and the proximal aspect of the flexor retinaculum distal forearm fascia was then released under direct vision using blunt scissors after dissecting the deep structures free with blunt scissors.  The wound was irrigated.  The canal was explored.  No further lesions were identified.  An area compression to the nerve was apparent.  Motor branch had in the muscle distally.  A irrigation was then carried out with saline.  The wound was closed with 4-0 nylon sutures.  A sterile compressive dressing with the fingers 3 was applied.  Inflation of the tourniquet all fingers immediately pink.  She was taken to the recovery room for observation in satisfactory condition.  She will be discharged home to return to the hand center [facility] in 1 week Tylenol ibuprofen for pain with tramadol for breakthrough.   Cindee Salt, MD Electronically signed, 12/20/20

## 2021-01-22 NOTE — Progress Notes (Signed)
NEUROLOGY FOLLOW UP OFFICE NOTE  Alison Curry [id number]  Assessment/Plan:   Migraine with aura, without status migrainosus, not intractable, increased due to stress.  1.  Migraine prevention:  She will continue to try Ajovy for 3 more months.  If no improvement, will change preventative 2.  Migraine rescue:  Tosymra NS or naproxen 3.  For neck pain, tizanidine 2mg  at bedtime.  May take up to 3 times daily as needed, but cautioned for drowsiness.  If neck pain persists, consider physical therapy or referral to Sports Medicine. 4.  Limit use of pain relievers to no more than 2 days out of week to prevent risk of rebound or medication-overuse headache. 5.  Keep headache diary 6.  Follow up 6 months.  Subjective:  Alison Curry is a 45 year old Alison Curry who follows up for migraines.  UPDATE: Increased Aimovig in September.  n 2022, [employer] would no longer cover Aimovig, so she was switched to Ajovy in late January.  She had her second injection last Sunday.  She was a few days off.  She does report more headaches over past month.  May be stress-related.  Headaches are of fluctuating intensity, pounding, different locations and associated with photophobia and phonophobia but not nausea or visual disturbance.  They typically start later in the day and last a few hours.  They have been occurring 3 days a week.  She has some right-sided neck pain.  Since starting Ajovy, she reports tremors in her hands and sometimes feels drunk when she wakes up.  Rescue protocol: Naproxen or Tosymra. Current NSAIDS:Naproxen 550 mg Current analgesics:Rarely hydrocodone, lidoderm patch Current triptans:Tosymra Current ergotamine:none Current anti-emetic:None Current muscle relaxants:Flexeril Current anti-anxiolytic:Xanax Current sleep aide:none Current Antihypertensive medications:none Current Antidepressant medications:Lexapro Current Anticonvulsant medications:none Current  anti-CGRP:Ajovy Current Vitamins/Herbal/Supplements:none Current Antihistamines/Decongestants:none Other therapy:None Birth control/hormone: None Other medication:Adderall  Depression and anxiety: Controlled Other pain:  Chronic joint pain.  Lumbar radiculopathy s/p L4-5 laminectomy and fusion in May 2021  HISTORY: Onset: 2013.She was initially diagnosed with trigeminal neuralgia, as it occurred on the right side following a root canal.Later, she was diagnosed with combination of trigeminal neuralgia and migraine.Workup included MRI of brain and cervical spine. Location: Started at base of the skull, radiating to the ears, jaw and head.Initially it was right sided, then became bilateral.She also reports neck pain. Quality:Constant pain.It was not paroxysmal or electric Initial intensity:7-8/10, sometimes up to 10/10 Aura:sometimes sees orbs Prodrome:no Postdrome: Head is sore/scalp tingles Associated symptoms: Nausea when severe.  Initial Duration:Over a day Initial Frequency:25 headache days per month (8-10 severe migraine where she is incapacitated and nauseous) Triggers: Unknown  Past abortive medication:Advil, Aleve, Excedrin, Tylenol, Vicodin, Maxalt Past preventative medication:Nortriptyline, amitriptyline, propranolol, atenolol, Cymbalta, Zoloft (all with side effects), topiramate 100mg , Botox (effective but no longer able to afford it), Aimovig 140mg  (effective but no longer covered by her insurance)  She had an MRI of the brain without and with contrast performed 11/15/12, which was normal.  PAST MEDICAL HISTORY: Past Medical History:  Diagnosis Date  . Allergy   . Anemia    past hx   . Anxiety and depression   . Cervical herniated disc   . Constipation    on linzess   . GERD (gastroesophageal reflux disease)   . Hypothyroidism   . Lumbar herniated disc   . Menometrorrhagia   . Migraines   . PID (pelvic inflammatory  disease)     MEDICATIONS: Current Outpatient Medications on File Prior to  Visit  Medication Sig Dispense Refill  . acyclovir (ZOVIRAX) 400 MG tablet Take 400 mg by mouth 2 (two) times daily as needed (for blisters).   10  . ALPRAZolam (XANAX) 0.25 MG tablet Take 0.25 mg by mouth daily as needed for anxiety or sleep.   2  . amphetamine-dextroamphetamine (ADDERALL XR) 15 MG 24 hr capsule Take 15 mg by mouth every morning. Takes 1 tablet by mouth ( Monday- Friday)    . amphetamine-dextroamphetamine (ADDERALL XR) 5 MG 24 hr capsule Take 10 mg by mouth daily. Takes only twice a week ( Saturday and Sunday)    . cyanocobalamin (,VITAMIN B-12,) 1000 MCG/ML injection Inject 1 mL into the muscle every 14 (fourteen) days.  1  . cyclobenzaprine (FLEXERIL) 10 MG tablet Take 1 tablet (10 mg total) by mouth 3 (three) times daily as needed for muscle spasms. 90 tablet 3  . escitalopram (LEXAPRO) 10 MG tablet Take 10 mg by mouth daily.     . famotidine (PEPCID) 20 MG tablet Take 20 mg by mouth 2 (two) times daily.    . Fremanezumab-vfrm (AJOVY) 225 MG/1.5ML SOAJ Inject 225 mg into the skin every 28 (twenty-eight) days. 1.68 mL 5  . HYDROcodone-acetaminophen (NORCO/VICODIN) 5-325 MG tablet Take 1-2 tablets by mouth every 6 (six) hours as needed. 10 tablet 0  . levothyroxine (SYNTHROID) 112 MCG tablet Take 112 mcg by mouth daily before breakfast.    . LINZESS 72 MCG capsule TAKE 1 CAPSULE (72 MCG TOTAL) BY MOUTH DAILY. (Patient taking differently: Take 72 mcg by mouth every other day.) 30 capsule 0  . naproxen sodium (ANAPROX) 550 MG tablet TAKE 1 TABLET BY MOUTH EVERY 12 HOURS AS NEEDED 20 tablet 5  . sucralfate (CARAFATE) 1 g tablet Take 1 g by mouth daily.    . SUMAtriptan (TOSYMRA) 10 MG/ACT SOLN Place 10 mg into the nose as needed (May repeat in 1 hour.  Maximum 2 sprays in 24 hours.). 9 each 5  . traMADol (ULTRAM) 50 MG tablet Take 1 tablet (50 mg total) by mouth every 6 (six) hours as needed. 20 tablet 0   . [DISCONTINUED] dexlansoprazole (DEXILANT) 60 MG capsule Take 1 capsule (60 mg total) by mouth daily. (Patient not taking: Reported on 03/12/2020) 30 capsule 6  . [DISCONTINUED] rizatriptan (MAXALT) 10 MG tablet TAKE 1 TABLET (10 MG TOTAL) BY MOUTH ONCE AS NEEDED. MAY TAKE A SECOND DOSE AFTER 2 HOURS IF NEEDED (Patient not taking: Reported on 03/12/2020) 10 tablet 6   No current facility-administered medications on file prior to visit.    ALLERGIES: Allergies  Allergen Reactions  . Bupropion Other (See Comments)    Nervousness  . Cephalexin Other (See Comments)    nervousness    FAMILY HISTORY: Family History  Problem Relation Age of Onset  . Osteoporosis Mother   . Atrial fibrillation Mother   . Heart disease Mother   . Curry cancer Neg Hx   . Esophageal cancer Neg Hx   . Stomach cancer Neg Hx   . Curry polyps Neg Hx   . Rectal cancer Neg Hx       Objective:  Blood pressure 118/80, pulse 95, resp. rate 20, height 5\' 1"  (1.549 m), weight 120 lb (54.4 kg), SpO2 97 %. General: No acute distress.  Patient appears well-groomed.   Head:  Normocephalic/atraumatic Eyes:  Fundi examined but not visualized Neck: supple, no paraspinal tenderness, full range of motion Heart:  Regular rate and rhythm Lungs:  Clear to auscultation  bilaterally Back: No paraspinal tenderness Neurological Exam: alert and oriented to person, place, and time. Attention span and concentration intact, recent and remote memory intact, fund of knowledge intact.  Speech fluent and not dysarthric, language intact.  CN II-XII intact. Bulk and tone normal, muscle strength 5/5 throughout.  Sensation to light touch, temperature and vibration intact.  Deep tendon reflexes 2+ throughout, toes downgoing.  Finger to nose and heel to shin testing intact.  Gait normal, Romberg negative.     Shon Millet, DO  CC:  Feliciana Rossetti, MD

## 2021-01-23 ENCOUNTER — Other Ambulatory Visit: Payer: Self-pay

## 2021-01-23 ENCOUNTER — Ambulatory Visit: Payer: BC Managed Care – PPO | Admitting: Neurology

## 2021-01-23 ENCOUNTER — Encounter: Payer: Self-pay | Admitting: Neurology

## 2021-01-23 VITALS — BP 118/80 | HR 95 | Resp 20 | Ht 61.0 in | Wt 120.0 lb

## 2021-01-23 DIAGNOSIS — M542 Cervicalgia: Secondary | ICD-10-CM | POA: Diagnosis not present

## 2021-01-23 DIAGNOSIS — G43109 Migraine with aura, not intractable, without status migrainosus: Secondary | ICD-10-CM | POA: Diagnosis not present

## 2021-01-23 MED ORDER — TIZANIDINE HCL 2 MG PO TABS: 2.0000 mg | ORAL_TABLET | Freq: Three times a day (TID) | ORAL | 5 refills | Status: DC | PRN

## 2021-01-23 NOTE — Patient Instructions (Signed)
1.  Continue Ajovy every 28 days for now.  Keep headache diary.  If no improvement in 3 months, contact me. 2.  Take tizanidine 2mg  at bedtime for neck pain.  May take up to three times daily as needed but caution for drowsiness 3.  Use naproxen and/or Tosymra NS as needed.  Limit use of pain relievers to no more than 2 days out of week to prevent risk of rebound or medication-overuse headache. 4.  Follow up in 6 months.

## 2021-04-15 IMAGING — US US ABDOMEN COMPLETE
1 series · 14 of 25 positions shown · non-contrast
Comparison: None

CLINICAL DATA: RIGHT upper quadrant abdominal pain radiating to
back, bloating, negative EGD

EXAM:
ABDOMEN ULTRASOUND COMPLETE

[Series 1: us abdomen complete · 14 of 62 slices shown]
[im 1/62]
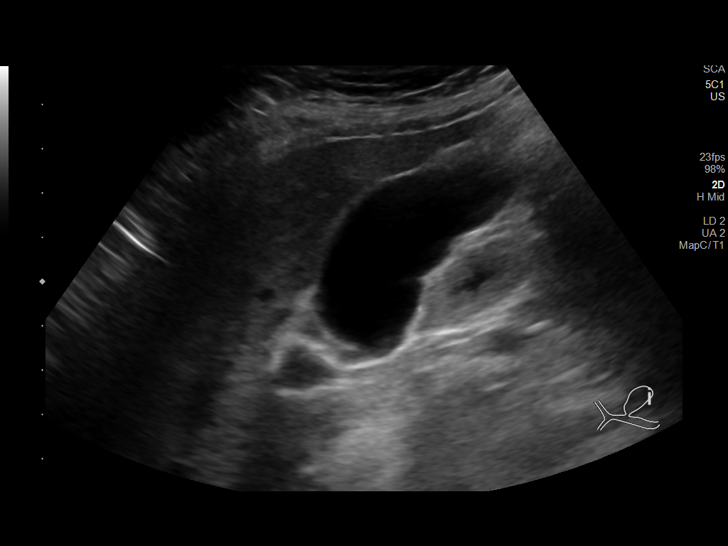
[im 6/62]
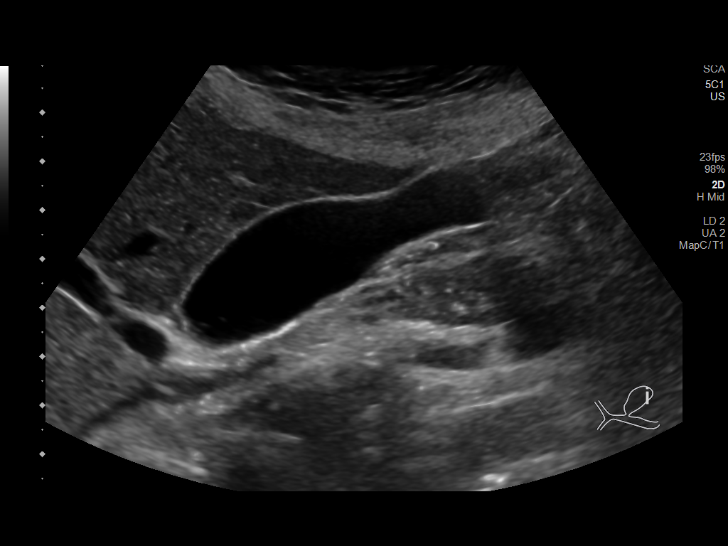
[im 11/62]
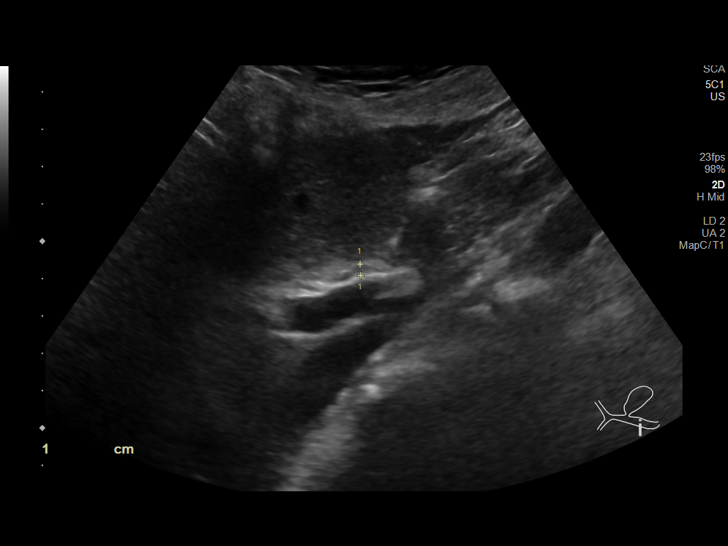
[im 16/62]
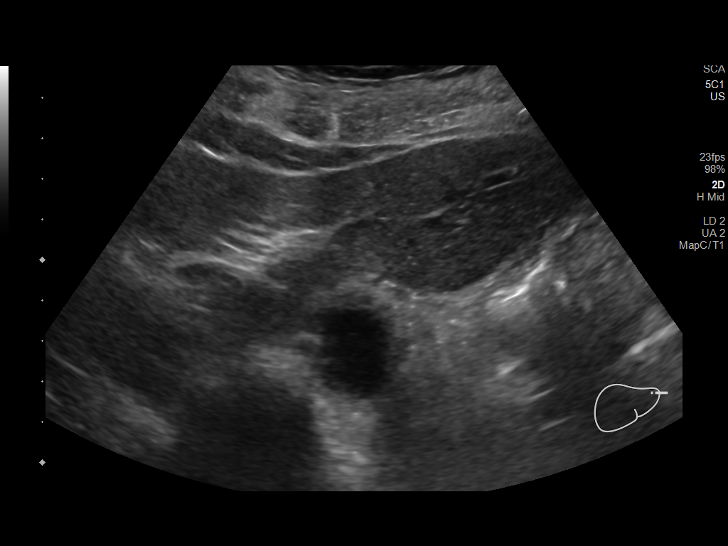
[im 21/62]
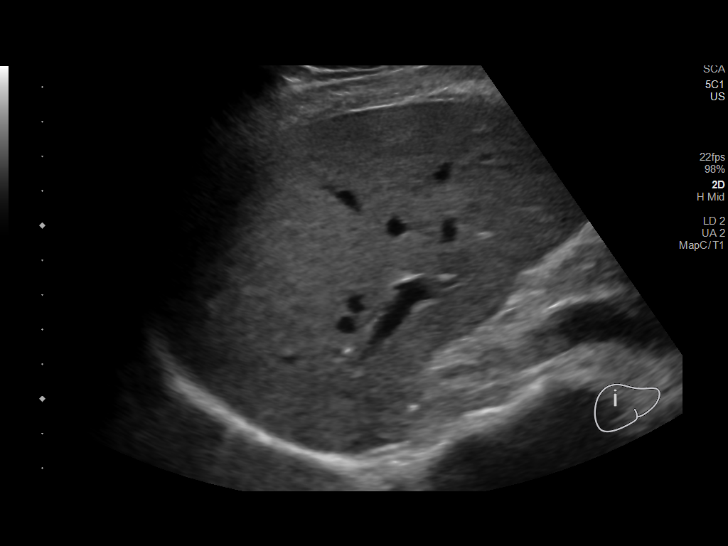
[im 23/62]
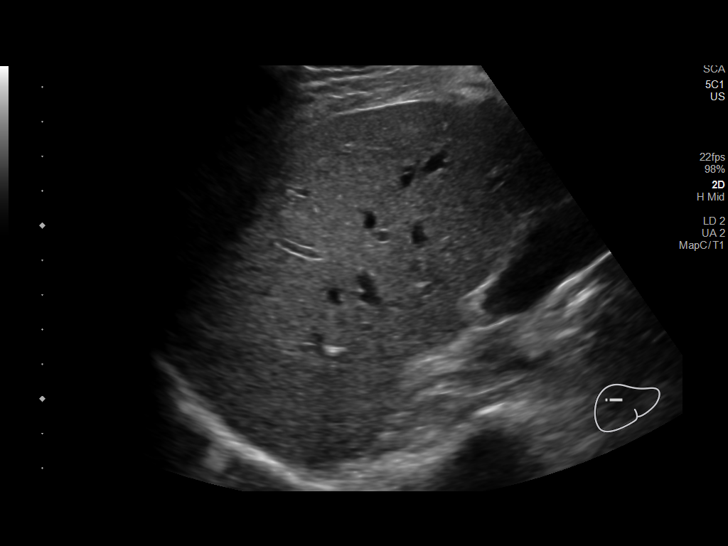
[im 28/62]
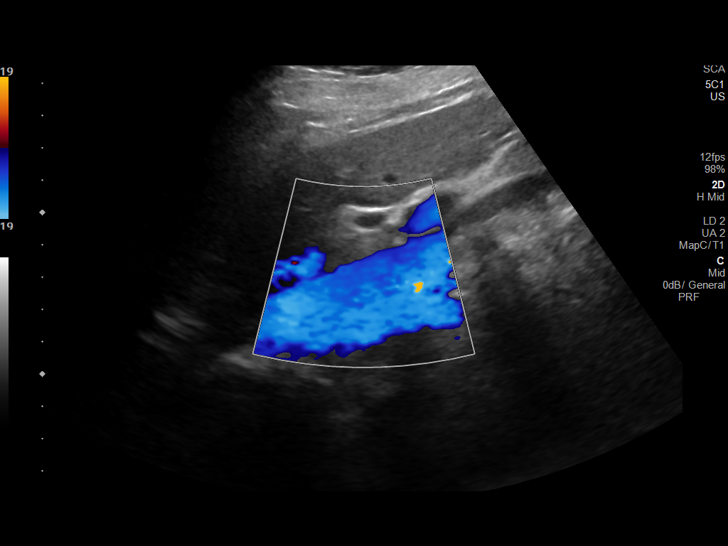
[im 34/62]
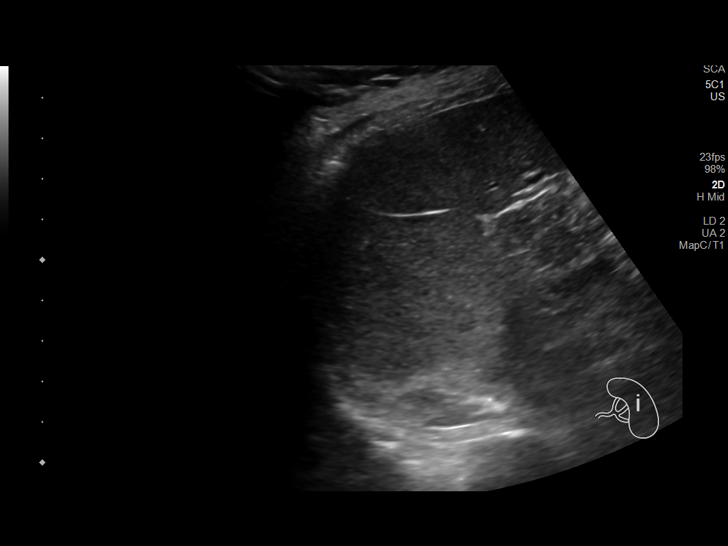
[im 39/62]
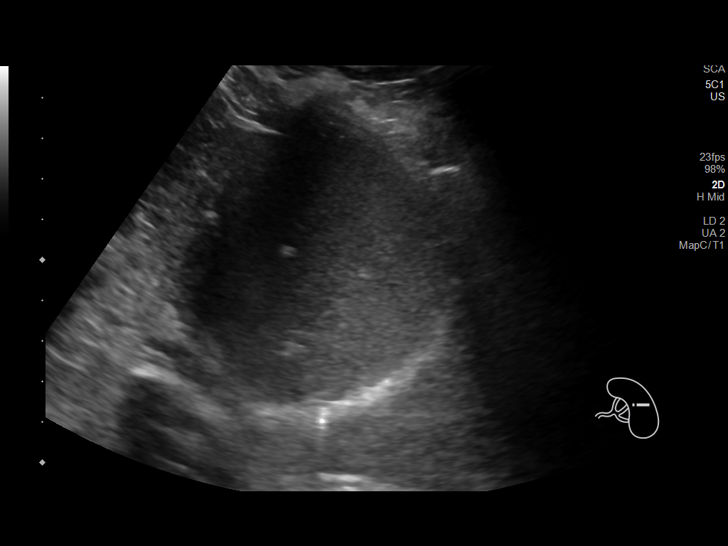
[im 41/62]
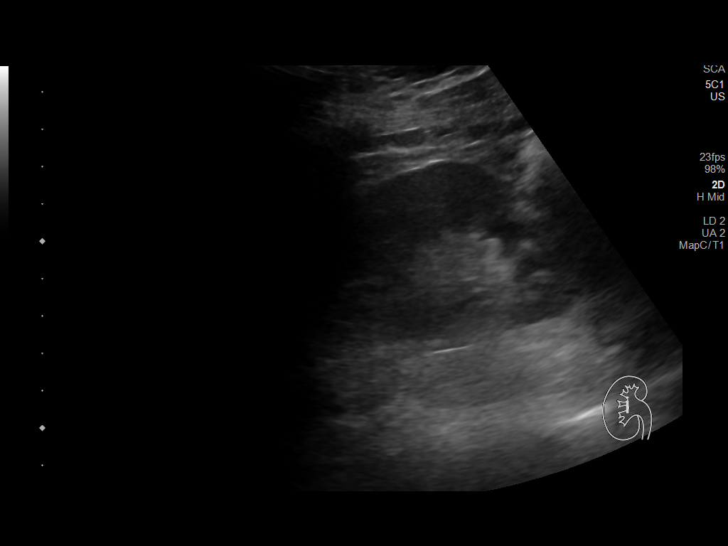
[im 46/62]
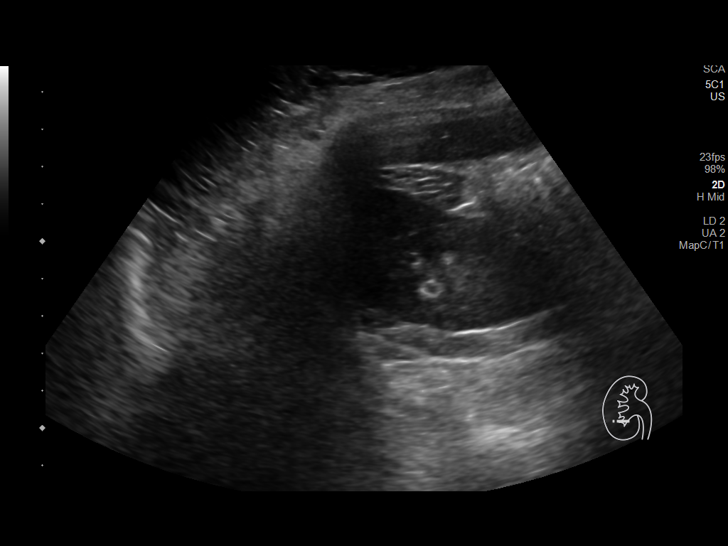
[im 51/62]
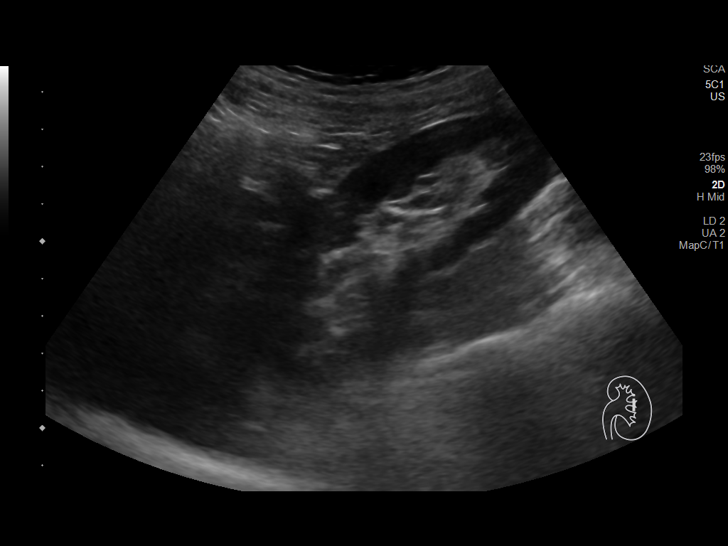
[im 56/62]
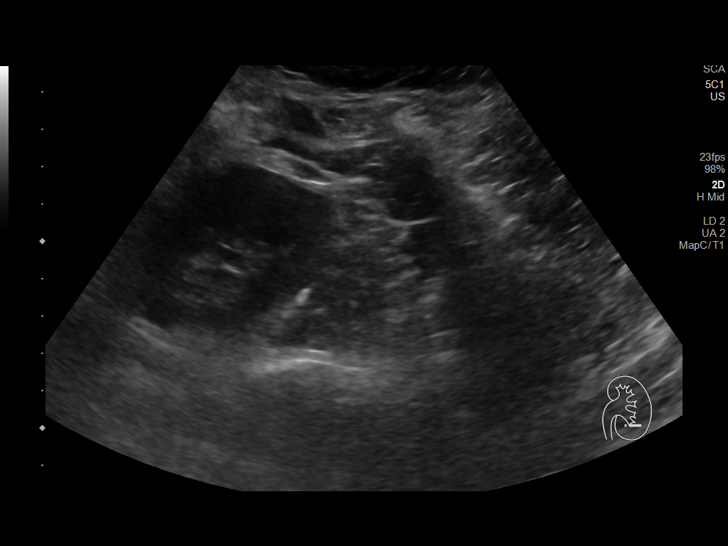
[im 62/62]
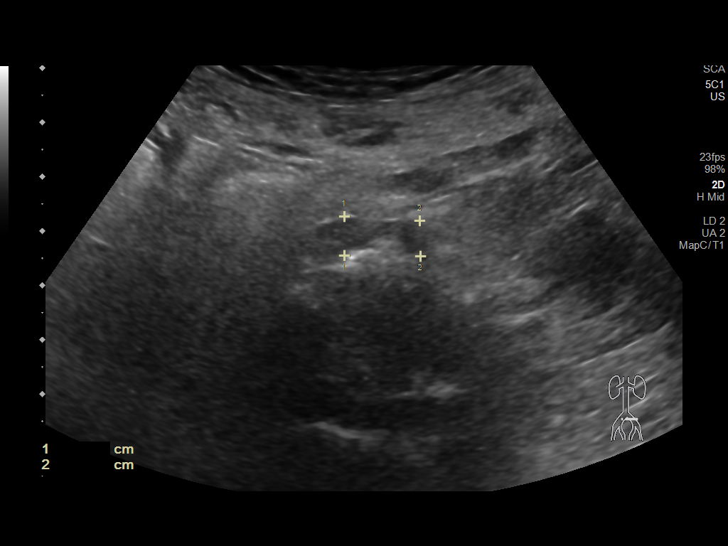

[14 of 25 positions shown; findings below may reference images not displayed]

FINDINGS: Gallbladder: Normally distended without stones or wall thickening.
No pericholecystic fluid or sonographic Murphy sign.

Common bile duct: Diameter: Normal caliber 2 mm

Liver: Normal echogenicity without mass or nodularity. Portal vein
is patent on color Doppler imaging with normal direction of blood
flow towards the liver.

IVC: Normal appearance

Pancreas: Normal appearance

Spleen: 6.4 cm length. Few echogenic foci question calcified
granulomata. No masses.

Right Kidney: Length: 9.7 cm. Normal morphology without mass or
hydronephrosis.

Left Kidney: Length: 9.8 cm. Normal morphology without mass or
hydronephrosis.

Abdominal aorta: Normal caliber

Other findings: No free fluid
IMPRESSION: Probable calcified granulomata within spleen.

Otherwise normal exam.

## 2021-06-11 ENCOUNTER — Other Ambulatory Visit: Payer: Self-pay | Admitting: Neurology

## 2021-07-17 ENCOUNTER — Other Ambulatory Visit: Payer: Self-pay | Admitting: Neurology

## 2021-08-07 ENCOUNTER — Encounter: Payer: Self-pay | Admitting: Neurology

## 2021-08-07 ENCOUNTER — Other Ambulatory Visit: Payer: Self-pay

## 2021-08-07 ENCOUNTER — Ambulatory Visit: Payer: BC Managed Care – PPO | Admitting: Neurology

## 2021-08-07 VITALS — BP 111/78 | HR 87 | Ht 61.0 in | Wt 118.8 lb

## 2021-08-07 DIAGNOSIS — M542 Cervicalgia: Secondary | ICD-10-CM

## 2021-08-07 DIAGNOSIS — G43719 Chronic migraine without aura, intractable, without status migrainosus: Secondary | ICD-10-CM | POA: Diagnosis not present

## 2021-08-07 NOTE — Progress Notes (Signed)
NEUROLOGY FOLLOW UP OFFICE NOTE  Alison Curry [id number]  Assessment/Plan:   Chronic migraine with aura, without status migrainosus, intractable - she is back to having daily headaches for several months now would like to restart Botox since it was effective.  She has already tried and failed propranolol, atenolol, nortriptyline, amitriptyline, Cymbalta, sertraline, topiramate, Ajovy Cervicalgia   Migraine prevention:  She will contact her insurance to find out her copay for Botox.  If still expensive, plan to switch from Ajovy to [facility] Migraine rescue:  Will have her try Nurtec.  She doesn't like using the nasal spray (Tosymra) and rizatriptan effective but caused drowsiness. Tizanidine as needed for neck/trapezius pain.  Will also refer to physical therapy. Limit use of pain relievers to no more than 2 days out of week to prevent risk of rebound or medication-overuse headache. Keep headache diary Follow up in 6 months or earlier if we proceed with Botox.    Subjective:  Alison Curry is a 45 year old right-handed woman who follows up for migraines.   UPDATE: She reports right trapezius pain.  Tizanidine helps.  Reports poor posture while sitting.   Intensity:  severe Duration:  until goes to sleep and wakes up okay but headache gradually increases Frequency:  4-5 days a week Has a daily headaches  Rescue protocol:  Naproxen and tizanidine (for neck/shoulder pain) Current NSAIDS: Naproxen 550 mg Current analgesics:  Rarely hydrocodone Current triptans: Tosymra NS Current ergotamine:  none Current anti-emetic:  None Current muscle relaxants:  tizanidine 2mg  QHS Current anti-anxiolytic: Xanax Current sleep aide:  none Current Antihypertensive medications:  none Current Antidepressant medications: Lexapro Current Anticonvulsant medications:  none Current anti-CGRP:  Ajovy Current Vitamins/Herbal/Supplements:  none Current Antihistamines/Decongestants:  none Other  therapy: None Birth control/hormone:  None Other medication:  Adderall, Acutane   Depression and anxiety:  Controlled Other pain:  Chronic joint pain.  Lumbar radiculopathy s/p L4-5 laminectomy and fusion in May 2021   HISTORY:  Onset: 2013.  She was initially diagnosed with trigeminal neuralgia, as it occurred on the right side following a root canal.   Later, she was diagnosed with combination of trigeminal neuralgia and migraine.  Workup included MRI of brain and cervical spine. Location: Started at base of the skull, radiating to the ears, jaw and head.   Initially it was right sided, then became bilateral.  She also reports neck pain. Quality:  Constant pain.  It was not paroxysmal or electric Initial intensity:  7-8/10, sometimes up to 10/10 Aura:  sometimes sees orbs Prodrome:  no Postdrome:  Head is sore/scalp tingles Associated symptoms: Nausea when severe. Chronic tinnitus gets worse.   Initial Duration:  Over a day Initial Frequency:  25 headache days per month (8-10 severe migraine where she is incapacitated and nauseous) Triggers: Unknown   Past abortive medication:  Advil, Aleve, Excedrin, Tylenol, Vicodin, Maxalt Past preventative medication:  Nortriptyline, amitriptyline, propranolol, atenolol, Cymbalta, Zoloft (all with side effects), topiramate 100mg , Botox (effective but no longer able to afford it), Aimovig 140mg  (effective but no longer covered by her insurance)   She had an MRI of the brain without and with contrast performed 11/15/12, which was normal.  PAST MEDICAL HISTORY: Past Medical History:  Diagnosis Date   Allergy    Anemia    past hx    Anxiety and depression    Cervical herniated disc    Constipation    on linzess    GERD (gastroesophageal reflux disease)  Hypothyroidism    Lumbar herniated disc    Menometrorrhagia    Migraines    PID (pelvic inflammatory disease)     MEDICATIONS: Current Outpatient Medications on File Prior to Visit   Medication Sig Dispense Refill   acyclovir (ZOVIRAX) 400 MG tablet Take 400 mg by mouth 2 (two) times daily as needed (for blisters).   10   AJOVY 225 MG/1.5ML SOAJ INJECT 225 MG INTO THE SKIN EVERY 28 (TWENTY-EIGHT) DAYS. 1 mL 0   ALPRAZolam (XANAX) 0.25 MG tablet Take 0.25 mg by mouth daily as needed for anxiety or sleep.   2   amphetamine-dextroamphetamine (ADDERALL XR) 15 MG 24 hr capsule Take 15 mg by mouth every morning. Takes 1 tablet by mouth ( Monday- Friday)     amphetamine-dextroamphetamine (ADDERALL XR) 5 MG 24 hr capsule Take 10 mg by mouth daily. Takes only twice a week ( Saturday and Sunday)     cyanocobalamin (,VITAMIN B-12,) 1000 MCG/ML injection Inject 1 mL into the muscle every 14 (fourteen) days.  1   escitalopram (LEXAPRO) 10 MG tablet Take 10 mg by mouth daily.      famotidine (PEPCID) 20 MG tablet Take 20 mg by mouth 2 (two) times daily.     HYDROcodone-acetaminophen (NORCO/VICODIN) 5-325 MG tablet Take 1-2 tablets by mouth every 6 (six) hours as needed. (Patient not taking: Reported on 01/23/2021) 10 tablet 0   ISOtretinoin (ACCUTANE) 30 MG capsule Take 30 mg by mouth daily.     levothyroxine (SYNTHROID) 112 MCG tablet Take 112 mcg by mouth daily before breakfast.     LINZESS 72 MCG capsule TAKE 1 CAPSULE (72 MCG TOTAL) BY MOUTH DAILY. (Patient taking differently: Take 72 mcg by mouth every other day.) 30 capsule 0   naproxen sodium (ANAPROX) 550 MG tablet TAKE 1 TABLET BY MOUTH EVERY 12 HOURS AS NEEDED 20 tablet 5   sucralfate (CARAFATE) 1 g tablet Take 1 g by mouth daily. (Patient not taking: Reported on 01/23/2021)     tiZANidine (ZANAFLEX) 2 MG tablet Take 1 tablet (2 mg total) by mouth every 8 (eight) hours as needed for muscle spasms. 90 tablet 5   traMADol (ULTRAM) 50 MG tablet Take 1 tablet (50 mg total) by mouth every 6 (six) hours as needed. (Patient not taking: Reported on 01/23/2021) 20 tablet 0   [DISCONTINUED] dexlansoprazole (DEXILANT) 60 MG capsule Take 1  capsule (60 mg total) by mouth daily. (Patient not taking: Reported on 03/12/2020) 30 capsule 6   [DISCONTINUED] rizatriptan (MAXALT) 10 MG tablet TAKE 1 TABLET (10 MG TOTAL) BY MOUTH ONCE AS NEEDED. MAY TAKE A SECOND DOSE AFTER 2 HOURS IF NEEDED (Patient not taking: Reported on 03/12/2020) 10 tablet 6   No current facility-administered medications on file prior to visit.    ALLERGIES: Allergies  Allergen Reactions   Bupropion Other (See Comments)    Nervousness   Cephalexin Other (See Comments)    nervousness    FAMILY HISTORY: Family History  Problem Relation Age of Onset   Osteoporosis Mother    Atrial fibrillation Mother    Heart disease Mother    Colon cancer Neg Hx    Esophageal cancer Neg Hx    Stomach cancer Neg Hx    Colon polyps Neg Hx    Rectal cancer Neg Hx       Objective:  Blood pressure 111/78, pulse 87, height 5\' 1"  (1.549 m), weight 118 lb 12.8 oz (53.9 kg), SpO2 99 %. General: No acute distress.  Patient appears well-groomed.     Shon Millet, DO  CC: Feliciana Rossetti, MD

## 2021-08-07 NOTE — Patient Instructions (Signed)
Contact your insurance to see how much botox will now cost. If not an option, will change from Ajovy to [facility] When you get a migraine, take Nurtec (maximum 1 tablet in 24 hours).  If effective, contact me for script.  Otherwise, will try a different medication Use tizanidine as needed for neck/shoulder pain Will refer you to physical therapy for neck pain Follow up in 6 months (or sooner if we proceed with botox)

## 2021-08-27 ENCOUNTER — Ambulatory Visit: Payer: BC Managed Care – PPO | Admitting: Physical Therapy

## 2021-09-03 ENCOUNTER — Encounter: Payer: BC Managed Care – PPO | Admitting: Physical Therapy

## 2021-09-11 ENCOUNTER — Encounter: Payer: BC Managed Care – PPO | Admitting: Physical Therapy

## 2021-09-17 ENCOUNTER — Encounter: Payer: BC Managed Care – PPO | Admitting: Physical Therapy

## 2021-09-24 ENCOUNTER — Encounter: Payer: BC Managed Care – PPO | Admitting: Physical Therapy

## 2021-10-02 ENCOUNTER — Telehealth: Payer: Self-pay | Admitting: Neurology

## 2021-10-02 MED ORDER — NURTEC 75 MG PO TBDP: 75.0000 mg | ORAL_TABLET | ORAL | 5 refills | Status: AC | PRN

## 2021-10-02 NOTE — Telephone Encounter (Signed)
Migraine prevention:  She will contact her insurance to find out her copay for Botox.  If still expensive, plan to switch from Ajovy to [employer]   [facility] by [facility].

## 2021-10-02 NOTE — Telephone Encounter (Signed)
Patient called and said she had samples of Nurtec and it worked well for her migraines.  Patient requesting a prescription for N[facility]tec.  [facility] [address] [facility] [place]  2.  Does Dr. Everlena Cooper want her to continue with Ajovy?

## 2021-10-08 ENCOUNTER — Encounter: Payer: BC Managed Care – PPO | Admitting: Physical Therapy

## 2021-10-30 ENCOUNTER — Telehealth: Payer: Self-pay

## 2021-10-30 NOTE — Telephone Encounter (Signed)
New message   Your information has been submitted to [employer]. To check for an updated outcome later, reopen this PA request from your dashboard.  If [employer] has not responded to your request within 24 hours, contact [employer] at [phone]. If you think there may be a problem with your PA request, use our live chat feature at the bottom right.  Vylet Muhs (Key: [id number]) Aimovig 140MG /ML auto-injectors   Form [employer] Electronic PA Form (2017 NCPDP) Created 5 days ago Sent to Plan 7 minutes ago Plan Response 7 minutes ago Submit Clinical Questions 1 minute ago Determination Wait for Determination Please wait for [employer] NCPDP 2017 to return a determination.

## 2021-11-13 ENCOUNTER — Telehealth: Payer: Self-pay | Admitting: Neurology

## 2021-11-13 NOTE — Telephone Encounter (Signed)
Patient is following up on a PA for her nurtec. She called her pharmacy and they told her they have not recvd anything from [place]. She wants to know if she is able to get a sample

## 2021-11-13 NOTE — Telephone Encounter (Signed)
Per pt she decided to stop her Preventive medication Aimovig. So right now she wants to just do the Nurtec. Pt states she gets headache all the time but a migraine only 3-4 a month.

## 2021-11-15 ENCOUNTER — Telehealth: Payer: Self-pay

## 2021-11-15 NOTE — Telephone Encounter (Signed)
New message   Alison Curry (Key: [id number]) Nurtec 75MG  dispersible tablets   Form [employer] Electronic PA Form (2017 NCPDP) Created 7 minutes ago Sent to Plan 5 minutes ago Plan Response 5 minutes ago Submit Clinical Questions 2 minutes ago Determination Wait for Determination Please wait for [employer] NCPDP 2017 to return a determination.   Your information has been submitted to [employer]. To check for an updated outcome later, reopen this PA request from your dashboard.  If [employer] has not responded to your request within 24 hours, contact [employer] at [phone]. If you think there may be a problem with your PA request, use our live chat feature at the bottom right.

## 2021-11-15 NOTE — Telephone Encounter (Signed)
F/u  Alison Curry (Key: [id number]) Nurtec 75MG  dispersible tablets   Form [employer] Electronic PA Form (2017 NCPDP) Created 7 minutes ago Sent to Plan 5 minutes ago Plan Response 5 minutes ago Submit Clinical Questions 2 minutes ago Determination Wait for Determination Please wait for [employer] NCPDP 2017 to return a determination.

## 2021-11-15 NOTE — Telephone Encounter (Signed)
F/u This request has received an approval. View the bottom of the request for an electronic copy of the approval letter.  Jaquayla Dalgleish (Key: [id number]) Nurtec 75MG  dispersible tablets   Form [employer] Electronic PA Form (2017 NCPDP) Created 2 hours ago Sent to Plan 2 hours ago Plan Response 2 hours ago Submit Clinical Questions 2 hours ago Determination Favorable 1 hour ago Message from [place] PA request has been approved. Additional information will be provided in the approval communication. (Message 1145)

## 2021-11-15 NOTE — Telephone Encounter (Signed)
F/u This request has received an approval. View the bottom of the request for an electronic copy of the approval letter.  Alison Curry (Key: [id number]) Nurtec 75MG dispersible tablets   Form [employer] Electronic PA Form (2017 NCPDP) Created 2 hours ago Sent to Plan 2 hours ago Plan Response 2 hours ago Submit Clinical Questions 2 hours ago Determination Favorable 1 hour ago Message from Plan Your PA request has been approved. Additional information will be provided in the approval communication. (Message 1145) 

## 2021-11-28 ENCOUNTER — Telehealth: Payer: Self-pay

## 2021-11-28 NOTE — Telephone Encounter (Signed)
New message   Trista Mcmahen KeyLadell Pier - PA Case ID: [id number] - Rx #: [id number] Need help? Call us at [phone] Status Sent to [facility] Drug AJOVY (fremanezumab-vfrm) injection 225MG /1.5ML auto-injectors Form [employer] Electronic PA Form (2017 NCPDP) Original Claim Info [id number] MUST MEET STEP, PA REQR [phone] REQUIRES PRIOR AUTHORIZATION   Your information has been submitted to [employer]. To check for an updated outcome later, reopen this PA request from your dashboard.  If [employer] has not responded to your request within 24 hours, contact [employer] at [phone]. If you think there may be a problem with your PA request, use our live chat feature at the bottom right.

## 2021-11-28 NOTE — Telephone Encounter (Signed)
Correction from previous note. She needs Ajovy Prior Authorization.

## 2021-11-28 NOTE — Telephone Encounter (Signed)
Pt called in stating she has a refill of Aimovig, but the pharmacy told her she needed prior authorization. I let her know it looks like something was put in earlier today.

## 2021-11-29 NOTE — Telephone Encounter (Signed)
F/u  Afsana Towe Key: Ladell Pier - PA Case ID: [id number] - Rx #: [id number] Need help? Call us at [phone] Outcome Approvedon January 18 Your PA request has been approved. Additional information will be provided in the approval communication. (Message 1145) Drug AJOVY (fremanezumab-vfrm) injection 225MG /1.5ML auto-injectors Form PA Form [phone] NCPDP) Original Claim Info [id number] MUST MEET STEP, PA REQR [phone] REQUIRES PRIOR AUTHORIZATION  Effective date  11-28-2021 to  11-28-2022

## 2022-03-01 NOTE — Progress Notes (Deleted)
NEUROLOGY FOLLOW UP OFFICE NOTE  Alison Curry [id number]  Assessment/Plan:   Chronic migraine with aura, without status migrainosus, intractable - she is back to having daily headaches for several months now would like to restart Botox since it was effective.  She has already tried and failed propranolol, atenolol, nortriptyline, amitriptyline, Cymbalta, sertraline, topiramate, Ajovy Cervicalgia     Migraine prevention:  She will contact her insurance to find out her copay for Botox.  If still expensive, plan to switch from Ajovy to [facility] Migraine rescue:  Will have her try Nurtec.  She doesn't like using the nasal spray (Tosymra) and rizatriptan effective but caused drowsiness. Tizanidine as needed for neck/trapezius pain.  Will also refer to physical therapy. Limit use of pain relievers to no more than 2 days out of week to prevent risk of rebound or medication-overuse headache. Keep headache diary Follow up in 6 months or earlier if we proceed with Botox.       Subjective:  Alison Curry is a 46 year old right-handed woman who follows up for migraines.   UPDATE: Copay for Botox was too expensive.  Started Aimovig.  *** Intensity:  severe Duration:  until goes to sleep and wakes up okay but headache gradually increases Frequency:  4-5 days a week Has a daily headaches   Rescue protocol:  Naproxen and tizanidine (for neck/shoulder pain) Current NSAIDS: Naproxen 550 mg Current analgesics:  Rarely hydrocodone Current triptans: Tosymra NS Current ergotamine:  none Current anti-emetic:  None Current muscle relaxants:  tizanidine 2mg  QHS Current anti-anxiolytic: Xanax Current sleep aide:  none Current Antihypertensive medications:  none Current Antidepressant medications: Lexapro Current Anticonvulsant medications:  none Current anti-CGRP:  Aimovig 140mg , Nurtec (rescue) Current Vitamins/Herbal/Supplements:  none Current Antihistamines/Decongestants:  none Other therapy:  None Birth control/hormone:  None Other medication:  Adderall, Acutane   Depression and anxiety:  Controlled Other pain:  Chronic joint pain.  Lumbar radiculopathy s/p L4-5 laminectomy and fusion in May 2021   HISTORY:  Onset: 2013.  She was initially diagnosed with trigeminal neuralgia, as it occurred on the right side following a root canal.   Later, she was diagnosed with combination of trigeminal neuralgia and migraine.  Workup included MRI of brain and cervical spine. Location: Started at base of the skull, radiating to the ears, jaw and head.   Initially it was right sided, then became bilateral.  She also reports neck pain. Quality:  Constant pain.  It was not paroxysmal or electric Initial intensity:  7-8/10, sometimes up to 10/10 Aura:  sometimes sees orbs Prodrome:  no Postdrome:  Head is sore/scalp tingles Associated symptoms: Nausea when severe. Chronic tinnitus gets worse.   Initial Duration:  Over a day Initial Frequency:  25 headache days per month (8-10 severe migraine where she is incapacitated and nauseous) Triggers: Unknown   Past abortive medication:  Advil, Aleve, Excedrin, Tylenol, Vicodin, Maxalt , Tosymra (does not like sprays) Past preventative medication:  Nortriptyline, amitriptyline, propranolol, atenolol, Cymbalta, Zoloft (all with side effects), topiramate 100mg , Botox (effective but no longer able to afford it), Aimovig 140mg  (effective but no longer covered by her insurance)   She had an MRI of the brain without and with contrast performed 11/15/12, which was normal.  PAST MEDICAL HISTORY: Past Medical History:  Diagnosis Date   Allergy    Anemia    past hx    Anxiety and depression    Cervical herniated disc    Constipation    on linzess  GERD (gastroesophageal reflux disease)    Hypothyroidism    Lumbar herniated disc    Menometrorrhagia    Migraines    PID (pelvic inflammatory disease)     MEDICATIONS: Current Outpatient Medications on  File Prior to Visit  Medication Sig Dispense Refill   acyclovir (ZOVIRAX) 400 MG tablet Take 400 mg by mouth 2 (two) times daily as needed (for blisters).   10   AJOVY 225 MG/1.5ML SOAJ INJECT 225 MG INTO THE SKIN EVERY 28 (TWENTY-EIGHT) DAYS. 1 mL 0   ALPRAZolam (XANAX) 0.25 MG tablet Take 0.25 mg by mouth daily as needed for anxiety or sleep.   2   amphetamine-dextroamphetamine (ADDERALL XR) 15 MG 24 hr capsule Take 10 mg by mouth every morning. Takes 1 tablet by mouth ( Monday- Friday)     amphetamine-dextroamphetamine (ADDERALL XR) 5 MG 24 hr capsule Take 10 mg by mouth daily. Takes only twice a week ( Saturday and Sunday)     cyanocobalamin (,VITAMIN B-12,) 1000 MCG/ML injection Inject 1 mL into the muscle every 14 (fourteen) days.  1   escitalopram (LEXAPRO) 10 MG tablet Take 10 mg by mouth daily.      famotidine (PEPCID) 20 MG tablet Take 20 mg by mouth 2 (two) times daily.     HYDROcodone-acetaminophen (NORCO/VICODIN) 5-325 MG tablet Take 1-2 tablets by mouth every 6 (six) hours as needed. 10 tablet 0   ISOtretinoin (ACCUTANE) 30 MG capsule Take 40 mg by mouth 2 (two) times daily.     levothyroxine (SYNTHROID) 112 MCG tablet Take 112 mcg by mouth daily before breakfast.     LINZESS 72 MCG capsule TAKE 1 CAPSULE (72 MCG TOTAL) BY MOUTH DAILY. (Patient taking differently: Take 72 mcg by mouth every other day.) 30 capsule 0   naproxen sodium (ANAPROX) 550 MG tablet TAKE 1 TABLET BY MOUTH EVERY 12 HOURS AS NEEDED 20 tablet 5   Rimegepant Sulfate (NURTEC) 75 MG TBDP Take 75 mg by mouth as needed (Take 1 tab at the earlist onset of a migraine. Max 1 tab in 24 hours.). 15 tablet 5   sucralfate (CARAFATE) 1 g tablet Take 1 g by mouth daily.     tiZANidine (ZANAFLEX) 2 MG tablet Take 1 tablet (2 mg total) by mouth every 8 (eight) hours as needed for muscle spasms. 90 tablet 5   traMADol (ULTRAM) 50 MG tablet Take 1 tablet (50 mg total) by mouth every 6 (six) hours as needed. 20 tablet 0    [DISCONTINUED] dexlansoprazole (DEXILANT) 60 MG capsule Take 1 capsule (60 mg total) by mouth daily. (Patient not taking: Reported on 03/12/2020) 30 capsule 6   [DISCONTINUED] rizatriptan (MAXALT) 10 MG tablet TAKE 1 TABLET (10 MG TOTAL) BY MOUTH ONCE AS NEEDED. MAY TAKE A SECOND DOSE AFTER 2 HOURS IF NEEDED (Patient not taking: Reported on 03/12/2020) 10 tablet 6   No current facility-administered medications on file prior to visit.    ALLERGIES: Allergies  Allergen Reactions   Bupropion Other (See Comments)    Nervousness   Cephalexin Other (See Comments)    nervousness    FAMILY HISTORY: Family History  Problem Relation Age of Onset   Osteoporosis Mother    Atrial fibrillation Mother    Heart disease Mother    Colon cancer Neg Hx    Esophageal cancer Neg Hx    Stomach cancer Neg Hx    Colon polyps Neg Hx    Rectal cancer Neg Hx  Objective:  *** General: No acute distress.  Patient appears well-groomed.   Head:  Normocephalic/atraumatic Eyes:  Fundi examined but not visualized Neck: supple, no paraspinal tenderness, full range of motion Heart:  Regular rate and rhythm Lungs:  Clear to auscultation bilaterally Back: No paraspinal tenderness Neurological Exam: alert and oriented to person, place, and time.  Speech fluent and not dysarthric, language intact.  CN II-XII intact. Bulk and tone normal, muscle strength 5/5 throughout.  Sensation to light touch intact.  Deep tendon reflexes 2+ throughout, toes downgoing.  Finger to nose testing intact.  Gait normal, Romberg negative.   Shon Millet, DO  CC: Feliciana Rossetti, MD

## 2022-03-04 ENCOUNTER — Ambulatory Visit: Payer: BC Managed Care – PPO | Admitting: Neurology

## 2022-03-06 ENCOUNTER — Other Ambulatory Visit: Payer: Self-pay | Admitting: Neurology

## 2022-05-17 ENCOUNTER — Ambulatory Visit: Payer: BC Managed Care – PPO | Admitting: Neurology

## 2022-06-07 ENCOUNTER — Encounter: Payer: Self-pay | Admitting: Nurse Practitioner

## 2022-06-14 ENCOUNTER — Encounter: Payer: Self-pay | Admitting: Radiology

## 2022-06-18 ENCOUNTER — Encounter: Payer: Self-pay | Admitting: Nurse Practitioner

## 2022-06-18 ENCOUNTER — Ambulatory Visit (INDEPENDENT_AMBULATORY_CARE_PROVIDER_SITE_OTHER): Payer: BC Managed Care – PPO | Admitting: Nurse Practitioner

## 2022-06-18 VITALS — BP 122/72 | Ht 61.5 in | Wt 138.0 lb

## 2022-06-18 DIAGNOSIS — Z01419 Encounter for gynecological examination (general) (routine) without abnormal findings: Secondary | ICD-10-CM

## 2022-06-18 DIAGNOSIS — Z79890 Hormone replacement therapy (postmenopausal): Secondary | ICD-10-CM

## 2022-06-18 DIAGNOSIS — N951 Menopausal and female climacteric states: Secondary | ICD-10-CM | POA: Diagnosis not present

## 2022-06-18 DIAGNOSIS — E039 Hypothyroidism, unspecified: Secondary | ICD-10-CM

## 2022-06-18 DIAGNOSIS — N941 Unspecified dyspareunia: Secondary | ICD-10-CM

## 2022-06-18 MED ORDER — PROGESTERONE MICRONIZED 100 MG PO CAPS: 100.0000 mg | ORAL_CAPSULE | Freq: Every evening | ORAL | 1 refills | Status: DC

## 2022-06-18 MED ORDER — ESTRADIOL 0.05 MG/24HR TD PTWK: 0.0500 mg | MEDICATED_PATCH | TRANSDERMAL | 1 refills | Status: DC

## 2022-06-18 NOTE — Progress Notes (Signed)
Alison Curry 1976-08-29 [id number]   History:  46 y.o. G4P2 presents as new patient to establish care. No cycles, H/O ablation. Thinks she is experiencing menopausal symptoms. Having hot flashes, insomnia, joint pain, pain with intercourse, weight gain, and brain fog. She is wondering if it is her thyroid. Normal TSH in May. Thyroid disease managed by PCP. Prediabetic.   Gynecologic History No LMP recorded. Patient has had an ablation.   Contraception/Family planning: tubal ligation Sexually active: Yes  Health Maintenance Last Pap: 2021 per patient. Results were: Normal Last mammogram: 05/08/2016. Results were: Normal Last colonoscopy: 02/07/2020. Results were: Normal, 10-year recall Last Dexa: Never  Past medical history, past surgical history, family history and social history were all reviewed and documented in the EPIC chart. 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 25 and 23 yo sons, one in [place], one in [place]. Mother with history of osteoporosis.   ROS:  A ROS was performed and pertinent positives and negatives are included.  Exam:  Vitals:   06/18/22 1526  BP: 122/72  Weight: 138 lb (62.6 kg)  Height: 5' 1.5" (1.562 m)   Body mass index is 25.65 kg/m.  General appearance:  Normal Thyroid:  Symmetrical, normal in size, without palpable masses or nodularity. Respiratory  Auscultation:  Clear without wheezing or rhonchi Cardiovascular  Auscultation:  Regular rate, without rubs, murmurs or gallops  Edema/varicosities:  Not grossly evident Abdominal  Soft,nontender, without masses, guarding or rebound.  Liver/spleen:  No organomegaly noted  Hernia:  None appreciated  Skin  Inspection:  Grossly normal Breasts: Examined lying and sitting.   Right: Without masses, retractions, nipple discharge or axillary adenopathy.   Left: Without masses, retractions, nipple discharge or axillary adenopathy. Genitourinary   Inguinal/mons:  Normal without inguinal  adenopathy  External genitalia:  Normal appearing vulva with no masses, tenderness, or lesions  BUS/Urethra/Skene's glands:  Normal  Vagina:  Normal appearing with normal color and discharge, no lesions  Cervix:  Normal appearing without discharge or lesions  Uterus:  Normal in size, shape and contour.  Midline and mobile, nontender  Adnexa/parametria:     Rt: Normal in size, without masses or tenderness.   Lt: Normal in size, without masses or tenderness.  Anus and perineum: Normal  Digital rectal exam: Normal sphincter tone without palpated masses or tenderness  Patient informed chaperone available to be present for breast and pelvic exam. Patient has requested no chaperone to be present. Patient has been advised what will be completed during breast and pelvic exam.   Assessment/Plan:  46 y.o. G4P2 to establish care.   Well female exam with routine gynecological exam - Education provided on SBEs, importance of preventative screenings, current guidelines, high calcium diet, regular exercise, and multivitamin daily.  Labs with PCP.   Hypothyroidism, unspecified type - Plan: TSH. Normal in May. Would like checked today due to current symptoms mentioned in HPI. Reports difficulty with managing in the past. Managed by PCP.   Hormone replacement therapy - Plan: progesterone (PROMETRIUM) 100 MG capsule nightly, estradiol (CLIMARA - DOSED IN MG/24 HR) 0.05 mg/24hr patch weekly. Discussed benefits of heart heath, bone health, and menopausal symptoms, as well as the risk of blood clots, heart attack, stroke, and breast cancer.   Menopausal symptoms - Plan: progesterone (PROMETRIUM) 100 MG capsule nightly, estradiol (CLIMARA - DOSED IN MG/24 HR) 0.05 mg/24hr patch weekly. Magnesium nightly for sleep.  Dyspareunia in female - Reports dryness and pain with intercourse. Recommend coconut oil for lubrication. HRT may  help as well.   Screening for cervical cancer - Normal Pap history.  Will repeat at  3-year interval per guidelines.  Screening for breast cancer - Normal mammogram history.  Overdue and encouraged to schedule soon. Normal breast exam today.  Screening for colon cancer - 2021 colonoscopy. Will repeat at 10-year interval per GI's recommendation.   Screening for osteoporosis - Mother with history of osteoporosis. Will plan DXA around age 30.   Return in 1 year for annual.      Olivia Mackie DNP, 4:11 PM 06/18/2022

## 2022-06-19 LAB — TSH: TSH: 0.41 mIU/L

## 2022-09-13 NOTE — Progress Notes (Unsigned)
NEUROLOGY FOLLOW UP OFFICE NOTE  AIRIANA ELMAN [id number]  Assessment/Plan:   Neuropathy  She will retry gabapentin but just bedtime dosing to reduce chance of drowsiness during the day.  Start 100mg  and titrate to 300mg  at bedtime NCV-EMG lower extremities Check labs:  B6, B12, ACE, IFE, Sjogren's, ANCA panel, Lyme, copper, Celiac panel, heavy metal screen, paraneoplastic panel Follow up after testing.  Subjective:  Floretta BLANCH STANG is a 46 year old right-handed female with migraines who presents for new problem, pain and numbness in the feet.  In mid-August, she began experiencing burning and tingling in both feet.  They also looked red.  It progressively got worse.  She started experiencing numbness as well as pain in the heels and back of her ankles.  It is a throbbing pain that would become excruciating when she would bear weight on them.  Legs would feel weak but not sure if it is really related to the pain.  She also reports restless leg symptoms as well.  No involvement of upper extremities.  She does have history of lumbar fusion and saw her spine surgeon.  Denies exacerbation of back pain.  MRI of lumbar spine on 08/30/2022 showed postoperative changes from interval PLIF at L4-5 without significant foraminal stenosis and left foraminal disc protrusion at L3-4 potentially affecting the exiting left L3 nerve root but no significant spinal canal stenosis.  She was advised to be evaluated for neuropathy.  Labs over the past year include:  negative ANA, sed rate 20, negative RF in August; Hgb A1c 5.9 and TSH 1.48 in May.  Denies new medication or change in environment.  Previous medications include gabapentin, pregablin, duloxetine, amitriptyline and nortriptyline.  Gabapentin and pregablin previously made her feel groggy.  She does not remember who the other medications made her feel.  Her mother had restless leg syndrome and neuropathy.    PAST MEDICAL HISTORY: Past Medical History:   Diagnosis Date   Allergy    Anemia    past hx    Anxiety and depression    Cervical herniated disc    Constipation    on linzess    GERD (gastroesophageal reflux disease)    Hypothyroidism    Lumbar herniated disc    Menometrorrhagia    Migraines    PID (pelvic inflammatory disease)     MEDICATIONS: Current Outpatient Medications on File Prior to Visit  Medication Sig Dispense Refill   acyclovir (ZOVIRAX) 400 MG tablet Take 400 mg by mouth 2 (two) times daily as needed (for blisters).   10   amphetamine-dextroamphetamine (ADDERALL XR) 15 MG 24 hr capsule Take 10 mg by mouth every morning. Takes 1 tablet by mouth ( Monday- Friday)     amphetamine-dextroamphetamine (ADDERALL XR) 5 MG 24 hr capsule Take 10 mg by mouth daily. Takes only twice a week ( Saturday and Sunday)     cyanocobalamin (,VITAMIN B-12,) 1000 MCG/ML injection Inject 1 mL into the muscle every 14 (fourteen) days.  1   escitalopram (LEXAPRO) 20 MG tablet Take 20 mg by mouth daily.     estradiol (CLIMARA - DOSED IN MG/24 HR) 0.05 mg/24hr patch Place 1 patch (0.05 mg total) onto the skin once a week. 12 patch 1   HYDROcodone-acetaminophen (NORCO/VICODIN) 5-325 MG tablet Take 1-2 tablets by mouth every 6 (six) hours as needed. (Patient not taking: Reported on 06/18/2022) 10 tablet 0   levothyroxine (SYNTHROID) 112 MCG tablet Take 112 mcg by mouth daily before breakfast.  LINZESS 72 MCG capsule TAKE 1 CAPSULE (72 MCG TOTAL) BY MOUTH DAILY. (Patient taking differently: Take 72 mcg by mouth every other day.) 30 capsule 0   progesterone (PROMETRIUM) 100 MG capsule Take 1 capsule (100 mg total) by mouth at bedtime. 90 capsule 1   Rimegepant Sulfate (NURTEC) 75 MG TBDP Take 75 mg by mouth as needed (Take 1 tab at the earlist onset of a migraine. Max 1 tab in 24 hours.). 15 tablet 5   [DISCONTINUED] dexlansoprazole (DEXILANT) 60 MG capsule Take 1 capsule (60 mg total) by mouth daily. (Patient not taking: Reported on 03/12/2020) 30  capsule 6   [DISCONTINUED] rizatriptan (MAXALT) 10 MG tablet TAKE 1 TABLET (10 MG TOTAL) BY MOUTH ONCE AS NEEDED. MAY TAKE A SECOND DOSE AFTER 2 HOURS IF NEEDED (Patient not taking: Reported on 03/12/2020) 10 tablet 6   No current facility-administered medications on file prior to visit.    ALLERGIES: Allergies  Allergen Reactions   Bupropion Other (See Comments)    Nervousness   Cephalexin Other (See Comments)    nervousness    FAMILY HISTORY: Family History  Problem Relation Age of Onset   Osteoporosis Mother    Atrial fibrillation Mother    Heart disease Mother    Colon cancer Neg Hx    Esophageal cancer Neg Hx    Stomach cancer Neg Hx    Colon polyps Neg Hx    Rectal cancer Neg Hx       Objective:  Blood pressure 108/78, pulse 90, height 5\' 1"  (1.549 m), weight 140 lb 6.4 oz (63.7 kg), SpO2 97 %. General: No acute distress.  Patient appears well-groomed.   Head:  Normocephalic/atraumatic Eyes:  Fundi examined but not visualized Neck: supple, no paraspinal tenderness, full range of motion Heart:  Regular rate and rhythm Lungs:  Clear to auscultation bilaterally Back: No paraspinal tenderness Neurological Exam: alert and oriented to person, place, and time.  Speech fluent and not dysarthric, language intact.  CN II-XII intact. Bulk and tone normal, muscle strength 5/5 throughout.  Sensation to pinprick reduced in feet up to ankles.  Vibratory sensation intact.  Deep tendon reflexes 3+ throughout except absent in ankles, toes downgoing.  Finger to nose testing intact.  Gait normal, Romberg negative.   , DO  CC:  Shon Millet, FNP  Koren Bound, MD

## 2022-09-16 ENCOUNTER — Other Ambulatory Visit (INDEPENDENT_AMBULATORY_CARE_PROVIDER_SITE_OTHER): Payer: BC Managed Care – PPO

## 2022-09-16 ENCOUNTER — Ambulatory Visit: Payer: BC Managed Care – PPO | Admitting: Neurology

## 2022-09-16 ENCOUNTER — Encounter: Payer: Self-pay | Admitting: Neurology

## 2022-09-16 VITALS — BP 108/78 | HR 90 | Ht 61.0 in | Wt 140.4 lb

## 2022-09-16 DIAGNOSIS — G6289 Other specified polyneuropathies: Secondary | ICD-10-CM

## 2022-09-16 MED ORDER — GABAPENTIN 100 MG PO CAPS: ORAL_CAPSULE | ORAL | 0 refills | Status: DC

## 2022-09-16 NOTE — Patient Instructions (Addendum)
Start gabapentin as directed Check labs:  Sjogren's, ACE, IFE, B12, B6, Heavy metal screen, copper, Lyme, Celiac, ANCA panel, paraneoplastic panel Check nerve conduction study of legs. Follow up after testing

## 2022-09-17 LAB — VITAMIN B12: Vitamin B-12: 509 pg/mL (ref 211–911)

## 2022-09-19 LAB — LYME DISEASE, WESTERN BLOT
IgG P18 Ab.: ABSENT
IgG P23 Ab.: ABSENT
IgG P28 Ab.: ABSENT
IgG P30 Ab.: ABSENT
IgG P39 Ab.: ABSENT
IgG P41 Ab.: ABSENT
IgG P45 Ab.: ABSENT
IgG P58 Ab.: ABSENT
IgG P66 Ab.: ABSENT
IgG P93 Ab.: ABSENT
IgM P23 Ab.: ABSENT
IgM P39 Ab.: ABSENT
IgM P41 Ab.: ABSENT
Lyme IgG Wb: NEGATIVE
Lyme IgM Wb: NEGATIVE

## 2022-09-19 LAB — ANGIOTENSIN CONVERTING ENZYME: Angiotensin-Converting Enzyme: 40 U/L (ref 9–67)

## 2022-09-19 LAB — HEAVY METALS, BLOOD
Arsenic: 2 ug/L (ref 0–9)
Lead, Blood: <1 ug/dL (ref 0.0–3.4)
Mercury: <1 ug/L (ref 0.0–14.9)

## 2022-09-19 LAB — IMMUNOFIXATION, SERUM
IgA/Immunoglobulin A, Serum: 196 mg/dL (ref 87–352)
IgG (Immunoglobin G), Serum: 718 mg/dL (ref 586–1602)
IgM (Immunoglobulin M), Srm: 58 mg/dL (ref 26–217)

## 2022-09-19 LAB — VITAMIN B6: Vitamin B6: 12.7 ng/mL (ref 2.1–21.7)

## 2022-09-19 LAB — GLIA (IGA/G) + TTG IGA
Antigliadin Abs, IgA: 11 units (ref 0–19)
Gliadin IgG: 8 units (ref 0–19)
Transglutaminase IgA: <2 U/mL (ref 0–3)

## 2022-09-19 LAB — COPPER, SERUM: Copper: 110 ug/dL (ref 70–175)

## 2022-09-19 LAB — ANCA SCREEN W REFLEX TITER: ANCA SCREEN: NEGATIVE

## 2022-09-20 ENCOUNTER — Telehealth: Payer: Self-pay | Admitting: Anesthesiology

## 2022-09-20 NOTE — Telephone Encounter (Signed)
Nicle from [facility] left message after hours to requesting information about patient's specimen. She needs to verify the collection date and time of specimen.

## 2022-09-20 NOTE — Telephone Encounter (Signed)
Called and talked to Alison Curry gave her date the IFE was collected.

## 2022-09-20 NOTE — Telephone Encounter (Signed)
Called back and not open at this time

## 2022-10-14 ENCOUNTER — Other Ambulatory Visit: Payer: Self-pay

## 2022-10-14 DIAGNOSIS — R202 Paresthesia of skin: Secondary | ICD-10-CM

## 2022-10-17 ENCOUNTER — Encounter: Payer: BC Managed Care – PPO | Admitting: Neurology

## 2022-10-31 ENCOUNTER — Telehealth: Payer: Self-pay | Admitting: Neurology

## 2022-10-31 NOTE — Telephone Encounter (Signed)
Pt called in and left a message. She stated her foot pain has started to ease back in in the afternoons. She is wondering if she might could increase her dose of gabapentin?

## 2022-10-31 NOTE — Telephone Encounter (Signed)
Pt would like to increase her medication

## 2022-11-01 ENCOUNTER — Other Ambulatory Visit: Payer: Self-pay | Admitting: Neurology

## 2022-11-01 MED ORDER — GABAPENTIN 100 MG PO CAPS: ORAL_CAPSULE | ORAL | 0 refills | Status: DC

## 2022-11-01 MED ORDER — GABAPENTIN 100 MG PO CAPS: ORAL_CAPSULE | ORAL | 0 refills | Status: AC

## 2022-11-05 NOTE — Telephone Encounter (Signed)
Advised patient of Dr,Jaffe note She is currently taking gabapentin 100mg  at bedtime.  I would like to increase dose as per the following: 100mg  capsule Take 1 capsule in morning, 1 capsule in afternoon and 3 capsules at night for one week, then 2 capsules in morning, 2 capsules in afternoon and 3 capsules at night for one week, then 3 capsules three times daily.  Quantity 270, refills 0  Please contact patient with these instructions and send new prescription to reflect change.

## 2022-11-21 ENCOUNTER — Ambulatory Visit: Payer: BC Managed Care – PPO | Admitting: Neurology

## 2022-11-21 DIAGNOSIS — R202 Paresthesia of skin: Secondary | ICD-10-CM | POA: Diagnosis not present

## 2022-11-21 NOTE — Procedures (Signed)
[facility]  [address], [place]  [place], [place] [address] Tel: [phone] Fax: [phone] Test Date:  11/21/2022  Patient: Alison Curry DOB: Sep 04, 1976 Physician: Narda Amber, DO  Sex: Female Height: 5\' 1"  Ref Phys: Metta Clines, DO  ID#: [id number]   Technician:    History: This is a 47 year old female referred for evaluation of bilateral feet paresthesias.  NCV & EMG Findings: Electrodiagnostic testing of the right lower extremity and additional studies of the left shows: Bilateral sural and superficial peroneal sensory responses are within normal limits. Bilateral peroneal and tibial motor responses are within normal limits. Bilateral tibial H reflex studies are within normal limits. There is no evidence of active or chronic motor axonal changes affecting any of the tested muscles.  Motor unit configuration and recruitment pattern is within normal limits.   Impression: This is a normal study of the lower extremities.  In particular, there is no evidence of a large fiber sensorimotor polyneuropathy or lumbosacral radiculopathy.    ___________________________ Narda Amber, DO    Nerve Conduction Studies   Stim Site NR Peak (ms) Norm Peak (ms) O-P Amp (V) Norm O-P Amp  Left Sup Peroneal Anti Sensory (Ant Lat Mall)  32 C  12 cm    1.5 <4.5 20.0 >5  Right Sup Peroneal Anti Sensory (Ant Lat Mall)  32 C  12 cm    1.8 <4.5 16.6 >5  Left Sural Anti Sensory (Lat Mall)  32 C  Calf    2.5 <4.5 14.1 >5  Right Sural Anti Sensory (Lat Mall)  32 C  Calf    2.1 <4.5 16.1 >5     Stim Site NR Onset (ms) Norm Onset (ms) O-P Amp (mV) Norm O-P Amp Site1 Site2 Delta-0 (ms) Dist (cm) Vel (m/s) Norm Vel (m/s)  Left Peroneal Motor (Ext Dig Brev)  32 C  Ankle    3.0 <5.5 4.0 >3 B Fib Ankle 6.2 36.0 58 >40  B Fib    9.2  3.8  Poplt B Fib 1.2 7.0 58 >40  Poplt    10.4  3.8         Right Peroneal Motor (Ext Dig Brev)  32 C  Ankle    2.3 <5.5 3.9 >3 B Fib Ankle  6.1 32.0 52 >40  B Fib    8.4  3.5  Poplt B Fib 1.5 8.0 53 >40  Poplt    9.9  3.5         Left Tibial Motor (Abd Hall Brev)  32 C  Ankle    4.1 <6.0 16.6 >8 Knee Ankle 7.0 38.0 54 >40  Knee    11.1  13.9         Right Tibial Motor (Abd Hall Brev)  32 C  Ankle    3.8 <6.0 22.1 >8 Knee Ankle 6.6 39.0 59 >40  Knee    10.4  13.7          Electromyography   Side Muscle Ins.Act Fibs Fasc Recrt Amp Dur Poly Activation Comment  Right AntTibialis Nml Nml Nml Nml Nml Nml Nml Nml N/A  Right Gastroc Nml Nml Nml Nml Nml Nml Nml Nml N/A  Right Flex Dig Long Nml Nml Nml Nml Nml Nml Nml Nml N/A  Right RectFemoris Nml Nml Nml Nml Nml Nml Nml Nml N/A  Right GluteusMed Nml Nml Nml Nml Nml Nml Nml Nml N/A  Left AntTibialis Nml Nml Nml Nml Nml Nml Nml Nml N/A  Left  Gastroc Nml Nml Nml Nml Nml Nml Nml Nml N/A  Left Flex Dig Long Nml Nml Nml Nml Nml Nml Nml Nml N/A  Left RectFemoris Nml Nml Nml Nml Nml Nml Nml Nml N/A  Left GluteusMed Nml Nml Nml Nml Nml Nml Nml Nml N/A      Waveforms:

## 2022-11-22 NOTE — Progress Notes (Signed)
Tried calling patient no answer. LMOVM to call the office back.

## 2022-11-25 ENCOUNTER — Telehealth: Payer: Self-pay

## 2022-11-25 ENCOUNTER — Telehealth: Payer: Self-pay | Admitting: Anesthesiology

## 2022-11-25 ENCOUNTER — Telehealth: Payer: Self-pay | Admitting: Pharmacy Technician

## 2022-11-25 NOTE — Telephone Encounter (Signed)
Telephone call to patient to advised of EMG results.  Patient advised. Patient agrees to the Skin Biopsy.  Front desk please give the patient a call to schedule with Dr.Hill.

## 2022-11-25 NOTE — Telephone Encounter (Signed)
Patient Advocate Encounter   Received notification that prior authorization for Nurtec 75MG  dispersible tablets is required.   PA submitted on 11/25/2022 Key [id number] Status is pending       Lyndel Safe, [place] Patient Advocate Specialist [place] Patient Advocate Team Direct Number: [phone]  Fax: [phone]

## 2022-11-25 NOTE — Telephone Encounter (Signed)
Results given.

## 2022-11-25 NOTE — Telephone Encounter (Signed)
-----  Message from Pieter Partridge, DO sent at 11/22/2022  6:58 AM EST ----- The nerve study is normal.  However, there are certain neuropathies that may be missed on this test.  We can do a punch skin biopsy where we take a very tiny piece of skin on the lower leg to test for neuropathy.  My colleague, Dr. Berdine Addison, performs it.  If she is agreeable, please schedule with Dr. Berdine Addison

## 2022-11-25 NOTE — Telephone Encounter (Signed)
Pt called stating she would like to get her EMG results.

## 2022-11-26 ENCOUNTER — Ambulatory Visit (INDEPENDENT_AMBULATORY_CARE_PROVIDER_SITE_OTHER): Payer: BC Managed Care – PPO | Admitting: Neurology

## 2022-11-26 DIAGNOSIS — R202 Paresthesia of skin: Secondary | ICD-10-CM

## 2022-11-26 NOTE — Progress Notes (Signed)
Punch Biopsy Procedure Note  Preprocedure Diagnosis: pain and numbness in bilateral feet   Postprocedure Diagnosis: same  Locations: Site 1: right lateral  distal leg ;  Site 2: right lateral thigh  Indications: r/o small fiber neuropathy  Anesthesia: 3 mL Lidocaine 1% with epinephrine  Procedure Details Patient informed of the risks (including but not limited to bleeding, pain, infection, scar and infection) and benefits of the procedure.  Informed consent obtained.  The areas which were chosen for biopsy, as above, and surrounding areas were given a sterile prep using iodine. The skin was then stretched perpendicular to the skin tension lines and sample removed using the 3 mm punch. Pressure applied, hemostasis achieved.   Dressing applied. The specimen(s) was sent for pathologic examination. The patient tolerated the procedure well.  Estimated Blood Loss: 1 ml  Condition: Stable  Complications: none.  Plan: 1. Instructed to keep the wound dry and covered for 24h and clean thereafter. 2. Warning signs of infection were reviewed.    Kai Levins, MD [facility]

## 2022-11-28 NOTE — Telephone Encounter (Signed)
Patient advised.

## 2022-11-28 NOTE — Telephone Encounter (Signed)
Patient Advocate Encounter  Prior Authorization for Nurtec 75MG  dispersible tablets has been approved through [employer].    Key [id number]   Effective: 11-25-2022 to 11-26-2023

## 2022-12-09 ENCOUNTER — Telehealth: Payer: Self-pay | Admitting: Neurology

## 2022-12-09 NOTE — Telephone Encounter (Signed)
Punch skin biopsy is negative.  At this time, I do not have an answer to her symptoms as it is apparent that it's not a neuropathy.  Labs, the nerve conduction study and the punch skin biopsy are all negative.

## 2022-12-10 NOTE — Telephone Encounter (Signed)
Tried calling patient no answer. LMOVM for patient to call the office back.  

## 2022-12-10 NOTE — Telephone Encounter (Signed)
Patient advised of her results. Please f/u with PCP for further recommendations.

## 2022-12-24 ENCOUNTER — Ambulatory Visit: Payer: BC Managed Care – PPO | Admitting: Podiatry

## 2022-12-24 ENCOUNTER — Ambulatory Visit (INDEPENDENT_AMBULATORY_CARE_PROVIDER_SITE_OTHER): Payer: BC Managed Care – PPO

## 2022-12-24 VITALS — BP 119/77 | HR 97

## 2022-12-24 DIAGNOSIS — M62462 Contracture of muscle, left lower leg: Secondary | ICD-10-CM

## 2022-12-24 DIAGNOSIS — G5753 Tarsal tunnel syndrome, bilateral lower limbs: Secondary | ICD-10-CM | POA: Diagnosis not present

## 2022-12-24 DIAGNOSIS — M722 Plantar fascial fibromatosis: Secondary | ICD-10-CM

## 2022-12-24 DIAGNOSIS — M62461 Contracture of muscle, right lower leg: Secondary | ICD-10-CM

## 2022-12-24 MED ORDER — MELOXICAM 15 MG PO TABS: 15.0000 mg | ORAL_TABLET | Freq: Every day | ORAL | 3 refills | Status: AC

## 2022-12-28 NOTE — Progress Notes (Signed)
  Subjective:  Patient ID: Alison Curry, female    DOB: 05/09/1976,  MRN: [id number]  Chief Complaint  Patient presents with   Foot Pain    bil foot numbness/tingling/heel pain    47 y.o. female presents with the above complaint. History confirmed with patient.  She has a history of low back issues and previous back surgery, she saw her neurologist and back specialist for this and had EMG/NCV they indicated this was not related to it.  MRI of the back was negative  Objective:  Physical Exam: warm, good capillary refill, no trophic changes or ulcerative lesions, normal DP and PT pulses, and normal sensory exam. Bilateral foot: point tenderness over the heel pad, gastrocnemius equinus is noted with a positive silverskiold test, and positive Tinel's in the tarsal tunnel   Radiographs: Multiple views x-ray of both feet: no fracture, dislocation, swelling or degenerative changes noted Assessment:   1. Bilateral plantar fasciitis   2. 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  Discussed the etiology and treatment options for plantar fasciitis including stretching, formal physical therapy, supportive shoegears such as a running shoe or sneaker, pre fabricated orthoses, injection therapy, and oral medications. We also discussed the role of surgical treatment of this for patients who do not improve after exhausting non-surgical treatment options.   -XR reviewed with patient -Educated patient on stretching and icing of the affected limb -Night splint dispensed -Injection delivered to the plantar fascia of both feet. -Rx for meloxicam. Educated on use, risks and benefits of the medication -Gabapentin not helpful she may discontinue this and see if this changes her symptoms -Did have some symptoms consistent with a tarsal tunnel syndrome.  EMG/NCV did not show evidence of this.  If not improving with treatment  for Planter fasciitis likely will need MRI of the ankle  After sterile prep with povidone-iodine solution and alcohol, the bilateral heel was injected with 0.5cc 2% xylocaine plain, 0.5cc 0.5% marcaine plain, 42m triamcinolone acetonide, and 246mdexamethasone was injected along the medial plantar fascia at the insertion on the plantar calcaneus. The patient tolerated the procedure well without complication.   No follow-ups on file.

## 2023-02-04 ENCOUNTER — Telehealth: Payer: Self-pay | Admitting: Podiatry

## 2023-02-04 NOTE — Telephone Encounter (Signed)
Patient is stating her heels are back hurting but she has an appointment to pick up her othotics on 02/10/2023. She would like to know what she needs do do.  Please Advise

## 2023-02-05 NOTE — Telephone Encounter (Signed)
Left a voicemail for the patient to call the office back. 

## 2023-02-06 NOTE — Telephone Encounter (Signed)
Pt called returning call to Eddie North the triage nurse, She is assisting today.  I read the message from Dr Sherryle Lis and she prefers to come in for the shot and is scheduled to see Dr Paulla Dolly 4.1.2024 at 345 to see Dr Paulla Dolly and 430 for orthotics. Pt said thank you.

## 2023-02-10 ENCOUNTER — Ambulatory Visit: Payer: BC Managed Care – PPO | Admitting: Podiatry

## 2023-02-10 ENCOUNTER — Other Ambulatory Visit: Payer: BC Managed Care – PPO

## 2023-02-10 DIAGNOSIS — M722 Plantar fascial fibromatosis: Secondary | ICD-10-CM

## 2023-02-10 MED ORDER — TRIAMCINOLONE ACETONIDE 10 MG/ML IJ SUSP
20.0000 mg | Freq: Once | INTRAMUSCULAR | Status: AC
  Administered 2023-02-10: 20 mg

## 2023-02-12 NOTE — Progress Notes (Signed)
Subjective:   Patient ID: Alison Curry, female   DOB: 47 y.o.   MRN: [id number]   HPI Patient states that recently she has had a reoccurrence of her heel pain and that it had done well for around 6 weeks.  States that she like to get some done temporarily and is starting her orthotics   ROS      Objective:  Physical Exam  Neuro vas scaler status intact with exquisite discomfort medial band fascia bilateral fluid buildup around the insertional point of the tendon into the calcaneus     Assessment:  Acute Planter fasciitis bilateral inflammation fluid medial band     Plan:  Discussed acute flareup and the importance of stretching exercises and good shoe gear usage.  Patient had sterile prep I reinjected the fascia at insertion 3 mg Kenalog 5 mg Xylocaine and applied sterile dressing.  Patient was instructed to reduce activity for several days will be seen back by Dr. Sherryle Lis approximately 1 month

## 2023-03-12 ENCOUNTER — Ambulatory Visit (INDEPENDENT_AMBULATORY_CARE_PROVIDER_SITE_OTHER): Payer: BC Managed Care – PPO | Admitting: Podiatry

## 2023-03-12 DIAGNOSIS — Z91199 Patient's noncompliance with other medical treatment and regimen due to unspecified reason: Secondary | ICD-10-CM

## 2023-03-13 NOTE — Progress Notes (Signed)
Patient was no-show for appointment today
